# Patient Record
Sex: Female | Born: 1995 | Hispanic: No | Marital: Single | Smoking: Current some day smoker
Health system: Southern US, Community
[De-identification: ages and names within clinical notes are randomized; demographics above are authoritative.]

## PROBLEM LIST (undated history)

## (undated) DIAGNOSIS — E669 Obesity, unspecified: Secondary | ICD-10-CM

## (undated) DIAGNOSIS — G4730 Sleep apnea, unspecified: Secondary | ICD-10-CM

## (undated) HISTORY — DX: Obesity, unspecified: E66.9

---
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
## 2014-01-30 ENCOUNTER — Ambulatory Visit: Payer: Self-pay | Admitting: Pediatrics

## 2014-02-20 ENCOUNTER — Ambulatory Visit (INDEPENDENT_AMBULATORY_CARE_PROVIDER_SITE_OTHER): Payer: Medicaid Other | Admitting: Pediatrics

## 2014-02-20 ENCOUNTER — Encounter: Payer: Self-pay | Admitting: Pediatrics

## 2014-02-20 VITALS — BP 124/80 | Ht 67.76 in | Wt 275.6 lb

## 2014-02-20 DIAGNOSIS — Z975 Presence of (intrauterine) contraceptive device: Secondary | ICD-10-CM

## 2014-02-20 NOTE — Progress Notes (Signed)
Pre-Visit Planning  Last STI screen:  Component     Latest Ref Rng 12/26/2013  Chlamydia, Swab/Urine, PCR     NEGATIVE NEGATIVE  GC Probe Amp, Urine     NEGATIVE NEGATIVE   Pertinent Labs: None  Review of previous notes:  Last seen by Dr. Marina Goodell on 12/26/13.  Treatment plan at last visit included Nexplanon insetion.   Previous Psych Screenings:  RAAPs, CRAFFT,  Psych screenings to be completed at visit: None  Last CPE: 08/15/13 Immunizations Due: FLU

## 2014-02-20 NOTE — Progress Notes (Signed)
10:42 AM  Adolescent Medicine Consultation Follow-Up Visit Carrie Allen  is a 18 y.o. female referred by Dr. Carlynn Purl here today for follow-up of nexplanon placement.   PCP Confirmed?  yes  PEREZ-FIERY,DENISE, MD   History was provided by the patient.  Last STI screen:  Component Latest Ref Rng  12/26/2013   Chlamydia, Swab/Urine, PCR NEGATIVE  NEGATIVE   GC Probe Amp, Urine NEGATIVE  NEGATIVE   Pertinent Labs: None  Review of previous notes:  Last seen by Dr. Marina Goodell on 12/26/13. Treatment plan at last visit included Nexplanon insetion.  Previous Psych Screenings: RAAPs, CRAFFT,  Psych screenings to be completed at visit:  None  Last CPE: 08/15/13  Immunizations Due: FLU indicated but did not offer to patient today  Growth Chart Viewed? yes  HPI:  Pt reports no concerns or questions.  No side effects  Patient's last menstrual period was 01/22/2014.  The following portions of the patient's history were reviewed and updated as appropriate: allergies, current medications and problem list.  No Known Allergies  Social History: Tobacco? Yes, once weekly Secondhand smoke exposure?yes, grandfather  Physical Exam:  Filed Vitals:   02/20/14 1029  BP: 124/80  Height: 5' 7.76" (1.721 m)  Weight: 275 lb 9.6 oz (125.011 kg)   BP 124/80  Ht 5' 7.76" (1.721 m)  Wt 275 lb 9.6 oz (125.011 kg)  BMI 42.21 kg/m2  LMP 01/22/2014 Body mass index: body mass index is 42.21 kg/(m^2). Blood pressure percentiles are 86% systolic and 90% diastolic based on 2000 NHANES data. Blood pressure percentile targets: 90: 126/80, 95: 130/84, 99: 142/96.  Physical Exam  Constitutional: No distress.  Neck: No thyromegaly present.  Cardiovascular: Normal rate and regular rhythm.   No murmur heard. Pulmonary/Chest: Breath sounds normal.  Musculoskeletal: She exhibits no edema.  Intact and well  Lymphadenopathy:    She has no cervical adenopathy.    Assessment/Plan: 1. Presence of subcutaneous  contraceptive implant No significant side effects.  Site well-healed.  Follow-up:  6 months  Medical decision-making:  > 15 minutes spent, more than 50% of appointment was spent discussing diagnosis and management of symptoms

## 2014-05-17 ENCOUNTER — Encounter: Payer: Self-pay | Admitting: Pediatrics

## 2014-09-20 ENCOUNTER — Emergency Department (HOSPITAL_COMMUNITY)
Admission: EM | Admit: 2014-09-20 | Discharge: 2014-09-20 | Disposition: A | Discharge status: Home / Self Care | Payer: Medicaid Other | Attending: Emergency Medicine | Admitting: Emergency Medicine

## 2014-09-20 ENCOUNTER — Encounter (HOSPITAL_COMMUNITY): Payer: Self-pay | Admitting: Emergency Medicine

## 2014-09-20 DIAGNOSIS — R5383 Other fatigue: Secondary | ICD-10-CM | POA: Diagnosis not present

## 2014-09-20 DIAGNOSIS — E669 Obesity, unspecified: Secondary | ICD-10-CM | POA: Insufficient documentation

## 2014-09-20 DIAGNOSIS — F1310 Sedative, hypnotic or anxiolytic abuse, uncomplicated: Secondary | ICD-10-CM | POA: Diagnosis not present

## 2014-09-20 DIAGNOSIS — F1210 Cannabis abuse, uncomplicated: Secondary | ICD-10-CM | POA: Insufficient documentation

## 2014-09-20 DIAGNOSIS — R110 Nausea: Secondary | ICD-10-CM | POA: Diagnosis not present

## 2014-09-20 MED ORDER — ONDANSETRON 8 MG PO TBDP
8.0000 mg | ORAL_TABLET | Freq: Once | ORAL | Status: AC
  Administered 2014-09-20: 8 mg via ORAL
  Filled 2014-09-20: qty 1

## 2014-09-20 NOTE — ED Notes (Signed)
Patient given sprite,. Able to keep down fine.

## 2014-09-20 NOTE — ED Provider Notes (Signed)
CSN: [id number]     Arrival date & time 09/20/14  1501 History   First MD Initiated Contact with Patient 09/20/14 1535     Chief Complaint  Patient presents with  . Fatigue     The history is provided by the patient. No language interpreter was used.   Ms. Duffy RhodyStanley presents for feeling fatigued. She states that last night she smokes weed and took 2 Xanax and passed out. She awoke around 1 PM this afternoon and doesn't know what happened throughout the night but her phone was gone. She denies taking any additional drugs. She denies any SI. She feels very tired and nauseous. She hasn't eaten since yesterday. She denies any fevers, cough, shortness of breath, abdominal pain, vomiting, diarrhea. She has a nexplanon and denies a chance of pregnancy. Symptoms are mild and improving.  Past Medical History  Diagnosis Date  . Obesity    History reviewed. No pertinent past surgical history. Family History  Problem Relation Age of Onset  . Autism Brother    History  Substance Use Topics  . Smoking status: Passive Smoke Exposure - Never Smoker  . Smokeless tobacco: Not on file  . Alcohol Use: No   OB History    No data available     Review of Systems  All other systems reviewed and are negative.     Allergies  Review of patient's allergies indicates no known allergies.  Home Medications   Prior to Admission medications   Not on File   BP 106/61 mmHg  Pulse 92  Temp(Src) 98.4 F (36.9 C) (Oral)  Resp 14  SpO2 98% Physical Exam  Constitutional: She is oriented to person, place, and time. She appears well-developed and well-nourished.  Appears fatigued  HENT:  Head: Normocephalic and atraumatic.  Eyes: Pupils are equal, round, and reactive to light.  Cardiovascular: Normal rate and regular rhythm.   No murmur heard. Pulmonary/Chest: Effort normal and breath sounds normal. No respiratory distress.  Abdominal: Soft. There is no tenderness. There is no rebound and no  guarding.  Musculoskeletal: She exhibits no edema or tenderness.  Neurological: She is alert and oriented to person, place, and time.  Moves all extremities symmetrically  Skin: Skin is warm and dry.  Psychiatric: She has a normal mood and affect. Her behavior is normal.  Nursing note and vitals reviewed.   ED Course  Procedures (including critical care time) Labs Review Labs Reviewed - No data to display  Imaging Review No results found.   EKG Interpretation None      MDM   Final diagnoses:  Nausea   patient here for evaluation of nausea and fatigue after smoking marijuana and taking Xanax yesterday. Patient appears drowsy but is conversant and able to drink and ambulate in the department without difficulty. Discussed with patient avoid illegal substances, oral fluid hydration, PCP follow-up and return precautions. Patient denies any other drug use.    Tilden FossaElizabeth Natallie Ravenscroft, MD 09/20/14 [phone]

## 2014-09-20 NOTE — Discharge Instructions (Signed)
Nausea, Adult Nausea is the feeling that you have an upset stomach or have to vomit. Nausea by itself is not likely a serious concern, but it may be an early sign of more serious medical problems. As nausea gets worse, it can lead to vomiting. If vomiting develops, there is the risk of dehydration.  CAUSES   Viral infections.  Food poisoning.  Medicines.  Pregnancy.  Motion sickness.  Migraine headaches.  Emotional distress.  Severe pain from any source.  Alcohol intoxication. HOME CARE INSTRUCTIONS  Get plenty of rest.  Ask your caregiver about specific rehydration instructions.  Eat small amounts of food and sip liquids more often.  Take all medicines as told by your caregiver. SEEK MEDICAL CARE IF:  You have not improved after 2 days, or you get worse.  You have a headache. SEEK IMMEDIATE MEDICAL CARE IF:   You have a fever.  You faint.  You keep vomiting or have blood in your vomit.  You are extremely weak or dehydrated.  You have dark or bloody stools.  You have severe chest or abdominal pain. MAKE SURE YOU:  Understand these instructions.  Will watch your condition.  Will get help right away if you are not doing well or get worse. Document Released: 06/25/2004 Document Revised: 02/10/2012 Document Reviewed: 01/28/2011 [facility] Patient Information 2015 [place], [place]. This information is not intended to replace advice given to you by your health care provider. Make sure you discuss any questions you have with your health care provider. Polysubstance Abuse When people abuse more than one drug or type of drug it is called polysubstance or polydrug abuse. For example, many smokers also drink alcohol. This is one form of polydrug abuse. Polydrug abuse also refers to the use of a drug to counteract an unpleasant effect produced by another drug. It may also be used to help with withdrawal from another drug. People who take stimulants may become agitated.  Sometimes this agitation is countered with a tranquilizer. This helps protect against the unpleasant side effects. Polydrug abuse also refers to the use of different drugs at the same time.  Anytime drug use is interfering with normal living activities, it has become abuse. This includes problems with family and friends. Psychological dependence has developed when your mind tells you that the drug is needed. This is usually followed by physical dependence which has developed when continuing increases of drug are required to get the same feeling or "high". This is known as addiction or chemical dependency. A person's risk is much higher if there is a history of chemical dependency in the family. SIGNS OF CHEMICAL DEPENDENCY  You have been told by friends or family that drugs have become a problem.  You fight when using drugs.  You are having blackouts (not remembering what you do while using).  You feel sick from using drugs but continue using.  You lie about use or amounts of drugs (chemicals) used.  You need chemicals to get you going.  You are suffering in work performance or in school because of drug use.  You get sick from use of drugs but continue to use anyway.  You need drugs to relate to people or feel comfortable in social situations.  You use drugs to forget problems. "Yes" answered to any of the above signs of chemical dependency indicates there are problems. The longer the use of drugs continues, the greater the problems will become. If there is a family history of drug or alcohol use,  it is best not to experiment with these drugs. Continual use leads to tolerance. After tolerance develops more of the drug is needed to get the same feeling. This is followed by addiction. With addiction, drugs become the most important part of life. It becomes more important to take drugs than participate in the other usual activities of life. This includes relating to friends and family. Addiction  is followed by dependency. Dependency is a condition where drugs are now needed not just to get high, but to feel normal. Addiction cannot be cured but it can be stopped. This often requires outside help and the care of professionals. Treatment centers are listed in the yellow pages under: Cocaine, Narcotics, and Alcoholics Anonymous. Most hospitals and clinics can refer you to a specialized care center. Talk to your caregiver if you need help. Document Released: 01/07/2005 Document Revised: 08/10/2011 Document Reviewed: 05/18/2005 [facility] Patient Information 2015 [place], [place]. This information is not intended to replace advice given to you by your health care provider. Make sure you discuss any questions you have with your health care provider.

## 2014-09-20 NOTE — ED Notes (Signed)
Per EMS: pt smoked weed and took 2 xanax last night and went to sleep, pt woke up this morning still feeling drowsy. Pt states she blacked out last night.

## 2014-10-27 ENCOUNTER — Emergency Department (HOSPITAL_COMMUNITY)
Admission: EM | Admit: 2014-10-27 | Discharge: 2014-10-27 | Disposition: A | Discharge status: Home / Self Care | Payer: Medicaid Other | Attending: Emergency Medicine | Admitting: Emergency Medicine

## 2014-10-27 ENCOUNTER — Encounter (HOSPITAL_COMMUNITY): Payer: Self-pay | Admitting: *Deleted

## 2014-10-27 DIAGNOSIS — F419 Anxiety disorder, unspecified: Secondary | ICD-10-CM | POA: Diagnosis not present

## 2014-10-27 DIAGNOSIS — F329 Major depressive disorder, single episode, unspecified: Secondary | ICD-10-CM | POA: Insufficient documentation

## 2014-10-27 DIAGNOSIS — F1210 Cannabis abuse, uncomplicated: Secondary | ICD-10-CM | POA: Insufficient documentation

## 2014-10-27 DIAGNOSIS — R45851 Suicidal ideations: Secondary | ICD-10-CM | POA: Diagnosis present

## 2014-10-27 DIAGNOSIS — F32A Depression, unspecified: Secondary | ICD-10-CM

## 2014-10-27 DIAGNOSIS — F1410 Cocaine abuse, uncomplicated: Secondary | ICD-10-CM | POA: Diagnosis not present

## 2014-10-27 DIAGNOSIS — E669 Obesity, unspecified: Secondary | ICD-10-CM | POA: Insufficient documentation

## 2014-10-27 DIAGNOSIS — F1910 Other psychoactive substance abuse, uncomplicated: Secondary | ICD-10-CM

## 2014-10-27 LAB — CBC WITH DIFFERENTIAL/PLATELET
BASOS PCT: 0 % (ref 0–1)
Basophils Absolute: 0 10*3/uL (ref 0.0–0.1)
Eosinophils Absolute: 0 10*3/uL (ref 0.0–0.7)
Eosinophils Relative: 0 % (ref 0–5)
HEMATOCRIT: 31.8 % — AB (ref 36.0–46.0)
Hemoglobin: 9.5 g/dL — ABNORMAL LOW (ref 12.0–15.0)
LYMPHS ABS: 1.5 10*3/uL (ref 0.7–4.0)
LYMPHS PCT: 11 % — AB (ref 12–46)
MCH: 21 pg — ABNORMAL LOW (ref 26.0–34.0)
MCHC: 29.9 g/dL — AB (ref 30.0–36.0)
MCV: 70.4 fL — ABNORMAL LOW (ref 78.0–100.0)
MONO ABS: 0.5 10*3/uL (ref 0.1–1.0)
Monocytes Relative: 4 % (ref 3–12)
NEUTROS PCT: 85 % — AB (ref 43–77)
Neutro Abs: 11.7 10*3/uL — ABNORMAL HIGH (ref 1.7–7.7)
Platelets: 258 10*3/uL (ref 150–400)
RBC: 4.52 MIL/uL (ref 3.87–5.11)
RDW: 19.5 % — ABNORMAL HIGH (ref 11.5–15.5)
WBC: 13.7 10*3/uL — ABNORMAL HIGH (ref 4.0–10.5)

## 2014-10-27 LAB — RAPID URINE DRUG SCREEN, HOSP PERFORMED
Amphetamines: NOT DETECTED
BARBITURATES: NOT DETECTED
Benzodiazepines: NOT DETECTED
Cocaine: POSITIVE — AB
Opiates: NOT DETECTED
TETRAHYDROCANNABINOL: POSITIVE — AB

## 2014-10-27 LAB — COMPREHENSIVE METABOLIC PANEL
ALBUMIN: 4.1 g/dL (ref 3.5–5.0)
ALT: 17 U/L (ref 14–54)
AST: 23 U/L (ref 15–41)
Alkaline Phosphatase: 100 U/L (ref 38–126)
Anion gap: 11 (ref 5–15)
BUN: 9 mg/dL (ref 6–20)
CO2: 25 mmol/L (ref 22–32)
CREATININE: 0.72 mg/dL (ref 0.44–1.00)
Calcium: 9.4 mg/dL (ref 8.9–10.3)
Chloride: 102 mmol/L (ref 101–111)
GFR calc Af Amer: >60 mL/min (ref >60)
GFR calc non Af Amer: >60 mL/min (ref >60)
GLUCOSE: 93 mg/dL (ref 65–99)
POTASSIUM: 3.6 mmol/L (ref 3.5–5.1)
SODIUM: 138 mmol/L (ref 135–145)
Total Bilirubin: 0.5 mg/dL (ref 0.3–1.2)
Total Protein: 7.9 g/dL (ref 6.5–8.1)

## 2014-10-27 LAB — ETHANOL: Alcohol, Ethyl (B): <5 mg/dL (ref <5)

## 2014-10-27 MED ORDER — IBUPROFEN 400 MG PO TABS: 600.0000 mg | ORAL_TABLET | Freq: Three times a day (TID) | ORAL | Status: DC | PRN

## 2014-10-27 MED ORDER — ACETAMINOPHEN 325 MG PO TABS: 650.0000 mg | ORAL_TABLET | ORAL | Status: DC | PRN

## 2014-10-27 MED ORDER — ONDANSETRON HCL 4 MG PO TABS: 4.0000 mg | ORAL_TABLET | Freq: Three times a day (TID) | ORAL | Status: DC | PRN

## 2014-10-27 MED ORDER — LORAZEPAM 1 MG PO TABS: 1.0000 mg | ORAL_TABLET | Freq: Three times a day (TID) | ORAL | Status: DC | PRN

## 2014-10-27 NOTE — ED Notes (Addendum)
Pt is here for SI, depression.  She and her boyfriend, who also just checked in, [facility].  [place] their way here [place].  Pt last did molly today and snorted cocaine on Wed.  Pt last drank alcohol yesterday. Pt states she has suicidal thoughts and panic attacks every day.  Pt states she was just kicked out of the "free hotel" this am for doing drugs.

## 2014-10-27 NOTE — ED Provider Notes (Signed)
CSN: [id number]     Arrival date & time 10/27/14  1224 History   First MD Initiated Contact with Patient 10/27/14 1246     Chief Complaint  Patient presents with  . Suicidal     (Consider location/radiation/quality/duration/timing/severity/associated sxs/prior Treatment) HPI    PCP: PEREZ-FIERY,DENISE, MD Blood pressure 146/85, pulse 95, temperature 98.6 F (37 C), temperature source Oral, resp. rate 16, height 5\' 7"  (1.702 m), weight 270 lb (122.471 kg), SpO2 100 %.  Carrie R Duffy RhodyStanley is a 19 y.o.female with a significant PMH of substance abuse presents to the ER with complaints of suicidal ideations and depression. She and her boyfriend have checked into the hospital at the same time, she is upset that they are not staying together because that is why they checked in together. She is tearful and crying because she wants to see him and would like to leave.  She was recently kicked out of a drug rehab program she was staying at because she used drugs on Wednesday for her birthday, she now has no where to go. She does not know why her boyfriend has checked in but said they basically have the same problems. She is unsure if he is depressed or suicidal.. She has recently used HeadrickMolly, cocaine and alcohol within the past week. Patient says that she wasn't lying about being suicidal but that she thought she would get to stay with her boyfriend. She reports only having vague thoughts but no clear plans. Pt reports she does not know what she thought might happened when she checked into the ER for these complaints but didn't care because she thought her and her bf would be together. She perseverates on her boyfriend and can't say a sentence without bringing him up. Denies any clear plans of suicide, reports having fleeting thoughts because she is depressed but denies she would ever do anything.  Past Medical History  Diagnosis Date  . Obesity    History reviewed. No pertinent past surgical  history. Family History  Problem Relation Age of Onset  . Autism Brother    History  Substance Use Topics  . Smoking status: Passive Smoke Exposure - Never Smoker  . Smokeless tobacco: Not on file  . Alcohol Use: Yes     Comment: occ   OB History    No data available     Review of Systems  10 Systems reviewed and are negative for acute change except as noted in the HPI.     Allergies  Review of patient's allergies indicates no known allergies.  Home Medications   Prior to Admission medications   Not on File   BP 146/85 mmHg  Pulse 95  Temp(Src) 98.6 F (37 C) (Oral)  Resp 16  Ht 5\' 7"  (1.702 m)  Wt 270 lb (122.471 kg)  BMI 42.28 kg/m2  SpO2 100% Physical Exam  Constitutional: She appears well-developed and well-nourished. No distress.  HENT:  Head: Normocephalic and atraumatic.  Eyes: Pupils are equal, round, and reactive to light.  Neck: Normal range of motion. Neck supple.  Cardiovascular: Normal rate and regular rhythm.   Pulmonary/Chest: Effort normal.  Abdominal: Soft.  Neurological: She is alert.  Skin: Skin is warm and dry.  Psychiatric: Her speech is normal. Her mood appears anxious. She is not actively hallucinating. She exhibits a depressed mood. She expresses suicidal ideation. She expresses no homicidal ideation. She expresses no suicidal plans and no homicidal plans.  +tearful  Nursing note and vitals reviewed.  ED Course  Procedures (including critical care time) Labs Review Labs Reviewed  CBC WITH DIFFERENTIAL/PLATELET - Abnormal; Notable for the following:    WBC 13.7 (*)    Hemoglobin 9.5 (*)    HCT 31.8 (*)    MCV 70.4 (*)    MCH 21.0 (*)    MCHC 29.9 (*)    RDW 19.5 (*)    All other components within normal limits  COMPREHENSIVE METABOLIC PANEL  ETHANOL  URINE RAPID DRUG SCREEN (HOSP PERFORMED) NOT AT [facility]    Imaging Review No results found.   EKG Interpretation None      MDM   Final diagnoses:  Polysubstance abuse   Anxiety  Depression    Pt is medically cleared, holding orders placed. TTS is going to talk with Psych NP, she suspects she will be monitored overnight and have a Telepsych in the AM.  Filed Vitals:   10/27/14 1230  BP: 146/85  Pulse: 95  Temp: 98.6 F (37 C)  Resp: [address] Neva Seat, PA-C 10/27/14 1615  Determined by Renata Caprice, NP that patient is safe for dc. Pt discharged home with resources.  Marlon Pel, PA-C 10/27/14 1638  Vanetta Mulders, MD 10/30/14 [phone]
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
## 2014-10-27 NOTE — Discharge Instructions (Signed)
Depression °Depression refers to feeling sad, low, down in the dumps, blue, gloomy, or empty. In general, there are two kinds of depression: °1. Normal sadness or normal grief. This kind of depression is one that we all feel from time to time after upsetting life experiences, such as the loss of a job or the ending of a relationship. This kind of depression is considered normal, is short lived, and resolves within a few days to 2 weeks. Depression experienced after the loss of a loved one (bereavement) often lasts longer than 2 weeks but normally gets better with time. °2. Clinical depression. This kind of depression lasts longer than normal sadness or normal grief or interferes with your ability to function at home, at work, and in school. It also interferes with your personal relationships. It affects almost every aspect of your life. Clinical depression is an illness. °Symptoms of depression can also be caused by conditions other than those mentioned above, such as: °· Physical illness. Some physical illnesses, including underactive thyroid gland (hypothyroidism), severe anemia, specific types of cancer, diabetes, uncontrolled seizures, heart and lung problems, strokes, and chronic pain are commonly associated with symptoms of depression. °· Side effects of some prescription medicine. In some people, certain types of medicine can cause symptoms of depression. °· Substance abuse. Abuse of alcohol and illicit drugs can cause symptoms of depression. °SYMPTOMS °Symptoms of normal sadness and normal grief include the following: °· Feeling sad or crying for short periods of time. °· Not caring about anything (apathy). °· Difficulty sleeping or sleeping too much. °· No longer able to enjoy the things you used to enjoy. °· Desire to be by oneself all the time (social isolation). °· Lack of energy or motivation. °· Difficulty concentrating or remembering. °· Change in appetite or weight. °· Restlessness or  agitation. °Symptoms of clinical depression include the same symptoms of normal sadness or normal grief and also the following symptoms: °· Feeling sad or crying all the time. °· Feelings of guilt or worthlessness. °· Feelings of hopelessness or helplessness. °· Thoughts of suicide or the desire to harm yourself (suicidal ideation). °· Loss of touch with reality (psychotic symptoms). Seeing or hearing things that are not real (hallucinations) or having false beliefs about your life or the people around you (delusions and paranoia). °DIAGNOSIS  °The diagnosis of clinical depression is usually based on how bad the symptoms are and how long they have lasted. Your health care provider will also ask you questions about your medical history and substance use to find out if physical illness, use of prescription medicine, or substance abuse is causing your depression. Your health care provider may also order blood tests. °TREATMENT  °Often, normal sadness and normal grief do not require treatment. However, sometimes antidepressant medicine is given for bereavement to ease the depressive symptoms until they resolve. °The treatment for clinical depression depends on how bad the symptoms are but often includes antidepressant medicine, counseling with a mental health professional, or both. Your health care provider will help to determine what treatment is best for you. °Depression caused by physical illness usually goes away with appropriate medical treatment of the illness. If prescription medicine is causing depression, talk with your health care provider about stopping the medicine, decreasing the dose, or changing to another medicine. °Depression caused by the abuse of alcohol or illicit drugs goes away when you stop using these substances. Some adults need professional help in order to stop drinking or using drugs. °SEEK IMMEDIATE MEDICAL   CARE IF:  You have thoughts about hurting yourself or others.  You lose touch  with reality (have psychotic symptoms).  You are taking medicine for depression and have a serious side effect. FOR MORE INFORMATION  National Alliance on Mental Illness: www.nami.[employer]org  National Institute of Mental Health: http://www.maynard.net/www.nimh.nih.gov Document Released: 05/15/2000 Document Revised: 10/02/2013 Document Reviewed: 08/17/2011 [facility] Patient Information 2015 [place], [place]. This information is not intended to replace advice given to you by your health care provider. Make sure you discuss any questions you have with your health care provider.  .resources

## 2014-10-27 NOTE — ED Notes (Signed)
Staffing Office made aware of the need for sitter.

## 2015-03-12 ENCOUNTER — Encounter: Payer: Self-pay | Admitting: Family

## 2015-03-12 NOTE — Progress Notes (Signed)
Patient ID: Smith Mince, female   DOB: 1995/06/22, 19 y.o.   MRN: [id number] Pre-Visit Planning  Dorena R Somera  is a 19 y.o. female referred by Maia Breslow, MD.   Last seen in [facility] on 02/20/2014  for nexplanon placement.   Previous Psych Screenings?  n/a    Clinical Staff Visit Tasks:   - Urine GC/CT due? yes - Psych Screenings Due? n/a - contraception sheet, condoms  Provider Visit Tasks: - Update sexual hx, evaluate menstrual cycle, contraceptive goals  - If nexplanon removal desired, schedule removal f/u  - Pertinent Labs? no

## 2015-03-15 ENCOUNTER — Encounter: Payer: Self-pay | Admitting: Family

## 2015-03-15 ENCOUNTER — Ambulatory Visit (INDEPENDENT_AMBULATORY_CARE_PROVIDER_SITE_OTHER): Payer: Medicaid Other | Admitting: Clinical

## 2015-03-15 ENCOUNTER — Ambulatory Visit (INDEPENDENT_AMBULATORY_CARE_PROVIDER_SITE_OTHER): Payer: Medicaid Other | Admitting: Family

## 2015-03-15 VITALS — BP 140/86 | HR 112 | Ht 67.75 in | Wt 286.6 lb

## 2015-03-15 DIAGNOSIS — R69 Illness, unspecified: Secondary | ICD-10-CM

## 2015-03-15 DIAGNOSIS — Z113 Encounter for screening for infections with a predominantly sexual mode of transmission: Secondary | ICD-10-CM

## 2015-03-15 DIAGNOSIS — Z3009 Encounter for other general counseling and advice on contraception: Secondary | ICD-10-CM

## 2015-03-15 DIAGNOSIS — Z23 Encounter for immunization: Secondary | ICD-10-CM | POA: Diagnosis not present

## 2015-03-15 DIAGNOSIS — Z975 Presence of (intrauterine) contraceptive device: Secondary | ICD-10-CM

## 2015-03-15 DIAGNOSIS — E669 Obesity, unspecified: Secondary | ICD-10-CM | POA: Diagnosis not present

## 2015-03-15 NOTE — Progress Notes (Signed)
THIS RECORD MAY CONTAIN CONFIDENTIAL INFORMATION THAT SHOULD NOT BE RELEASED WITHOUT REVIEW OF THE SERVICE PROVIDER.  Adolescent Medicine Consultation Follow-Up Visit Carrie Allen  is a 19 y.o. female referred by Maia BreslowPerez-Fiery, Denise, MD here today for follow-up of nexplanon and contraceptive management.     Growth Chart Viewed? no   History was provided by the patient.  PCP Confirmed?  yes  My Chart Activated?   no   Previsit planning completed:  Yes,  Patient ID: Carrie Allen, female   DOB: 05-18-1996, 19 y.o.   MRN: [id number] Pre-Visit Planning  Carrie Allen  is a 19 y.o. female referred by Maia BreslowPEREZ-FIERY,DENISE, MD.   Last seen in [facility] on 02/20/2014  for nexplanon placement.   Previous Psych Screenings?  n/a   Clinical Staff Visit Tasks:   - Urine GC/CT due? yes - Psych Screenings Due? n/a - contraception sheet, condoms  Provider Visit Tasks: - Update sexual hx, evaluate menstrual cycle, contraceptive goals  - If nexplanon removal desired, schedule removal f/u  - Pertinent Labs? no    HPI:   19 yo female presents to discuss change in birth control.  She is interested in switching from Nexplanon because she can feel it in her arm when she sleeps.  She has had the implant since last July.  When she first received the nexplanon, she had bleeding dark brown blood for 2 days, then nothing for 3 months, then monthly.  Now it is a light flow for 4 days.  Not cramping every cycle, if she does it is only for first day.  Interested in birth control pills.   BF wants to have a kid. Living together now.  She questions if she needs to be tested for HSV; BF has outbreaks periodically and they do not have intercourse when he has an outbreak. He has medication for the outbreaks, however does not always take it. She denies vaginal or oral lesions, no vaginal discharge changes, no pain with intercourse, no abdominal pain, or prodrome.  -She notes that she  has concerns over her weight and does not want to conceive right away; maybe sometime next year. Has some anxiety and depression; she denies SI/HI.   Patient's last menstrual period was 02/20/2015 (exact date). No Known Allergies No current outpatient prescriptions on file prior to visit.   No current facility-administered medications on file prior to visit.    Confidentiality was discussed with the patient and if applicable, with caregiver as well.  Patient's personal or confidential phone number: on file  Tobacco?  Yes, shares packs with boyfriend; 1-2 packs shared/day Drugs/ETOH?  Yes, marijuana  Partner preference?  female Sexually Active?  yes   Pregnancy Prevention:  implant, reviewed condoms & plan B Safe at home, in school & in relationships?  Yes Safe to self?  Yes    The following portions of the patient's history were reviewed and updated as appropriate: allergies, current medications, past family history, past medical history, past social history, past surgical history and problem list.  Physical Exam:  Filed Vitals:   03/15/15 1356  BP: 140/86  Pulse: 112  Height: 5' 7.75" (1.721 m)  Weight: 286 lb 9.6 oz (130.001 kg)   BP 140/86 mmHg  Pulse 112  Ht 5' 7.75" (1.721 m)  Wt 286 lb 9.6 oz (130.001 kg)  BMI 43.89 kg/m2  LMP 02/20/2015 (Exact Date) Body mass index: body mass index is 43.89 kg/(m^2). Facility age limit for growth percentiles is 20  years.  Physical Exam  Constitutional: She is oriented to person, place, and time. She appears well-developed. No distress.  Obese   HENT:  Head: Normocephalic and atraumatic.  Mouth/Throat: Oropharynx is clear and moist.  Eyes: EOM are normal. Pupils are equal, round, and reactive to light. No scleral icterus.  Neck: Normal range of motion. No thyromegaly present.  Cardiovascular: Normal rate, regular rhythm and normal heart sounds.   No murmur heard. Pulmonary/Chest: Effort normal and breath sounds normal. She has no  wheezes.  Abdominal: Soft. There is no tenderness.  Musculoskeletal: Normal range of motion. She exhibits no edema or tenderness.  Neurological: She is alert and oriented to person, place, and time.  Skin: Skin is warm and dry. No rash noted.  Psychiatric: She has a normal mood and affect.     Assessment/Plan: 1. Encounter for general counseling and advice on contraceptive management -discussed MEC for OCPs - discussed risks associated with smoking;  -smoking cessation discussed; patient pre-contemplative only.  -discussed preconception health; advised to stop smoking cigarettes and marijuana ideally at least 3 months before conception.  -also addressed prenatal vitamins; will send.  -patient to return for nexplanon removal and will begin OCPs at that time. No liver problems, no DVT, no PE; no cervical or breast CA; no migraines with aura.  -She was agreeable to speak with Jasmine briefly at this OV and for return OV with joint visit to further explore her concerns over anxiety/depression. Stable with no SI/HI at present.   2. Presence of subcutaneous contraceptive implant -palpable in arm; well-healed incision.    3. Obesity -discussed healthy weight prior to conception; encouraged increased physical activity and healthy eating/diet prior to conception.   4. Routine screening for STI (sexually transmitted infection) -discussed routine gc/c screening -extensive conversation around HSV, viral shedding, and that asymptomatic screening is not recommended.  -Encouraged condom use.  - GC/chlamydia probe amp, urine  5. Need for vaccination - Flu Vaccine QUAD 36+ mos IM   Follow-up:  2 weeks for removal.    Medical decision-making:  > 40 minutes spent, more than 50% of appointment was spent discussing diagnosis and management of symptoms

## 2015-03-16 LAB — GC/CHLAMYDIA PROBE AMP, URINE
CHLAMYDIA, SWAB/URINE, PCR: NEGATIVE
GC PROBE AMP, URINE: NEGATIVE

## 2015-03-18 ENCOUNTER — Telehealth: Payer: Self-pay

## 2015-03-18 NOTE — Telephone Encounter (Signed)
-----   Message from Christianne Dolinhristy Millican, NP sent at 03/17/2015  4:49 PM EDT ----- -gc/c negative. Notify patient.

## 2015-03-18 NOTE — Telephone Encounter (Signed)
RN called and spoke to Carrie Allen to let her know her lab results for GC/CT probe came back negative, no signs of infections. Carrie Allen stated gratitude for call with no further questions or concerns at this time.

## 2015-03-18 NOTE — BH Specialist Note (Signed)
VISIT DATE: 03/15/15 Referring Provider: Maia BreslowPEREZ-FIERY,DENISE, MD Session Time:  1540- 1550 (10 min) Type of Service: Behavioral Health - Individual/Family Interpreter: No.  Interpreter Name & Language: N/A   PRESENTING CONCERNS:  Carrie Allen is a 19 y.o. female brought in by grandmother. [facility] by C. Millican, NP regarding concerns with symptoms of anxiety & depression.  Carrie Allen could not stay long since her grandmother was waiting for her.   GOALS ADDRESSED:  Increase adequate support system.   INTERVENTIONS:  Introduced [facility] services within integrated team   ASSESSMENT/OUTCOME:  Carrie Allen agreed to meet [facility] another time.    PLAN FOR NEXT VISIT: Assess concerns further Identify specific goal   Scheduled next visit: 04/02/15 Joint visit with Carrie Allen. Millican, NP  Carrie BossierJasmine P Williams LCSW Behavioral Health Clinician [facility]   No charge for this visit due to brief length of time.

## 2015-04-01 ENCOUNTER — Encounter: Payer: Self-pay | Admitting: Pediatrics

## 2015-04-01 NOTE — Progress Notes (Signed)
Pre-Visit Planning  Carrie Allen  is a 19 y.o. female referred by Jairo BenMCQUEEN,SHANNON D, MD.   Last seen in [facility]oles[facility] Medicine Clinic on 03/15/15 for Nexplanon f/u. This visit is for depression/anxiety evaluation (joint visit with behavioral health).  Previous Psych Screenings?  yes CRAFFT: 2 positive responses.  Treatment plan at last visit included referral to behavior health clinician and planned joint visit on 04/02/15.  Clinical Staff Visit Tasks:   - Urine GC/CT due? No: negative on 03/15/15 - Psych Screenings Due? Yes: PHQ-SADS, ASRS, CRAFFT   Provider Visit Tasks: - evaluate substance abuse support, evaluate depressive/anxiety/ADHD symptoms - Pertinent Labs? Yes: cocaine and marijuana + UDS 10/27/14

## 2015-04-02 ENCOUNTER — Ambulatory Visit (INDEPENDENT_AMBULATORY_CARE_PROVIDER_SITE_OTHER): Payer: Medicaid Other | Admitting: Family

## 2015-04-02 ENCOUNTER — Encounter: Payer: Self-pay | Admitting: Family

## 2015-04-02 ENCOUNTER — Ambulatory Visit (INDEPENDENT_AMBULATORY_CARE_PROVIDER_SITE_OTHER): Payer: Medicaid Other | Admitting: Clinical

## 2015-04-02 VITALS — BP 138/88 | HR 105 | Ht 67.32 in | Wt 296.2 lb

## 2015-04-02 DIAGNOSIS — Z3049 Encounter for surveillance of other contraceptives: Secondary | ICD-10-CM

## 2015-04-02 DIAGNOSIS — F4323 Adjustment disorder with mixed anxiety and depressed mood: Secondary | ICD-10-CM

## 2015-04-02 DIAGNOSIS — Z30011 Encounter for initial prescription of contraceptive pills: Secondary | ICD-10-CM

## 2015-04-02 DIAGNOSIS — Z3046 Encounter for surveillance of implantable subdermal contraceptive: Secondary | ICD-10-CM

## 2015-04-02 MED ORDER — PRENATAL VITAMINS PLUS 27-1 MG PO TABS: 1.0000 | ORAL_TABLET | Freq: Every day | ORAL | Status: DC

## 2015-04-02 MED ORDER — NORETHIN ACE-ETH ESTRAD-FE 1.5-30 MG-MCG PO TABS: 1.0000 | ORAL_TABLET | Freq: Every day | ORAL | Status: DC

## 2015-04-02 NOTE — BH Specialist Note (Signed)
Referring Provider: Jairo BenMCQUEEN,SHANNON D, MD Session Time:  [id number] (45 minutes) Type of Service: Behavioral Health - Individual/Family Interpreter: No.  Interpreter Name & Language: N/A   PRESENTING CONCERNS:  Carrie Allen is a 19 y.o. female brought in by boyfriend. [facility] referred by C. Millican, NP regarding concerns with symptoms of anxiety & depression.  Carrie Allen wanted her boyfriend, Carrie Allen, present during the visit.  She reported ongoing stress living with her grandmother.   GOALS ADDRESSED:  Increase healthy habits Increase use of positive coping skills    INTERVENTIONS:  Introduced [facility] services within integrated team  SCREENS/ASSESSMENT TOOLS COMPLETED: PHQ-SADS 04/02/2015  PHQ-15 7  GAD-7 7  PHQ-9 15  ASRS 04/02/2015  Part A Total Symptoms Positive 0  Part B Total Symptoms Positive 7    CRAFFT Screening Questions: Part A - Yes to Drinking Alcohol & Smoking marijuana Part B - Yes to using alcohol or drugs to relax   ASSESSMENT/OUTCOME:  Carrie Allen appeared quiet at first but opened up as the visit continued.  Carrie Allen's boyfriend was engaged at times but let Carrie Allen talk through most of the visit.  Carrie Allen's goals today were to get the Nexplanon out and be more healthy since she's concerned about her weight.  Carrie Allen reported since she's lived with her grandmother, her physical & eating habits have worsened.  Carrie Allen's wants to get a job so she can have money to buy healthier foods and spend more time with her boyfriend & friends.  Carrie Allen reported thoughts of being better off denied but denied intent to kill herself or others.  She reported her boyfriend is supportive and she is able to talk to him about everything.  She stated that she would not harm or kill herself because of him.  Carrie Allen was not ready to change her alcohol or substance use.  She was motivated to change her eating & exercise habits.  She was willing to come back and identify more specific goals  that she wants to work on to be healthier.    PLAN FOR NEXT VISIT: Identify healthy habit she wants to work on with diet & exercise.    Scheduled next visit: [facility], M. Morris  Carrie Allen Behavioral Health Clinician [facility]

## 2015-04-02 NOTE — Patient Instructions (Addendum)
See Jasmine in 2 weeks; we will touch base then.  I will see you in 4 weeks.   Follow-up with  1 month. Schedule this appointment before you leave clinic today.  Your Nexplanon was removed today and is no longer preventing pregnancy.  If you have sex, remember to use condoms to prevent pregnancy and to prevent sexually transmitted infections.  Leave the outside bandage on for 24 hours.  Leave the smaller bandages on for 3-5 days or until they fall off on their own.  Keep the area clean and dry for 3-5 days.  There is usually bruising or swelling at and around the removal site for a few days to a week after the removal.  If you see redness or pus draining from the removal site, call us immediately.  We would like you to return to the clinic for a follow-up visit in 1 month.  You can call [facility] 24 hours a day with any questions or concerns.  There is always a nurse or doctor available to take your call.  Call 9-1-1 if you have a life-threatening emergency.  For anything else, please call us at [phone] before heading to the ER.

## 2015-04-02 NOTE — Progress Notes (Signed)
THIS RECORD MAY CONTAIN CONFIDENTIAL INFORMATION THAT SHOULD NOT BE RELEASED WITHOUT REVIEW OF THE SERVICE PROVIDER.  Adolescent Medicine Consultation Follow-Up Visit Carrie Allen  is a 19 y.o. female referred by Maia BreslowPerez-Fiery, Denise, MD here today for follow-up.    Growth Chart Viewed? no   History was provided by the patient.  PCP Confirmed?  Levora AngelYes, McQueen, MD    My Chart Activated?   no   Previsit planning completed:  Yes Pre-Visit Planning  Carrie Allen  is a 19 y.o. female referred by Jairo BenMCQUEEN,SHANNON D, MD.   Last seen in Adolescent Medicine Clinic on 03/15/15 for Nexplanon f/u. This visit is for depression/anxiety evaluation (joint visit with behavioral health).  Previous Psych Screenings?  yes CRAFFT: 2 positive responses.  Treatment plan at last visit included referral to behavior health clinician and planned joint visit on 04/02/15.  Clinical Staff Visit Tasks:   - Urine GC/CT due? No: negative on 03/15/15 - Psych Screenings Due? Yes: PHQ-SADS, ASRS, CRAFFT,   Provider Visit Tasks: - evaluate substance abuse support, evaluate depressive/anxiety/ADHD symptoms - Pertinent Labs? Yes: cocaine and marijuana + UDS 10/27/14   HPI:    19 yo female presents today for nexplanon removal. [facility] where she completed several screens: ASRS, CRAFFT, and PHQ-SADs. She plans to see Leavy CellaJasmine again in two weeks and would like to follow up on the screens at that time. Boyfriend accompanies her for this OV and she is ready to get the Nexplanon out. She would like to start OCPs today and has no further questions about that.     Patient's last menstrual period was 02/15/2015 (approximate). No Known Allergies No current outpatient prescriptions on file prior to visit.   No current facility-administered medications on file prior to visit.    Confidentiality was discussed with the patient and if applicable, with caregiver as well.  Patient's personal or  confidential phone number: on file  Review of Systems  Constitutional: Negative.   HENT: Negative.   Eyes: Negative.   Respiratory: Negative.   Cardiovascular: Negative.   Gastrointestinal: Negative.   Genitourinary: Negative.   Musculoskeletal: Negative.   Skin: Negative.   Neurological: Negative.   Endo/Heme/Allergies: Negative.     The following portions of the patient's history were reviewed and updated as appropriate: allergies, past family history, past medical history, past social history, past surgical history and problem list.  Physical Exam:  Filed Vitals:   04/02/15 1426  BP: 138/88  Pulse: 105  Height: 5' 7.32" (1.71 m)  Weight: 296 lb 3.2 oz (134.355 kg)   BP 138/88 mmHg  Pulse 105  Ht 5' 7.32" (1.71 m)  Wt 296 lb 3.2 oz (134.355 kg)  BMI 45.95 kg/m2  LMP 02/15/2015 (Approximate) Body mass index: body mass index is 45.95 kg/(m^2). Facility age limit for growth percentiles is 20 years.  Physical Exam  Constitutional: She is oriented to person, place, and time. She appears well-developed. No distress.  HENT:  Head: Normocephalic and atraumatic.  Eyes: EOM are normal. Pupils are equal, round, and reactive to light. No scleral icterus.  Neck: Normal range of motion. Neck supple. No thyromegaly present.  Cardiovascular: Normal rate, regular rhythm, normal heart sounds and intact distal pulses.   No murmur heard. Pulmonary/Chest: Effort normal and breath sounds normal.  Abdominal: Soft.  Musculoskeletal: Normal range of motion. She exhibits no edema.  Lymphadenopathy:    She has no cervical adenopathy.  Neurological: She is alert and  oriented to person, place, and time. No cranial nerve deficit.  Skin: Skin is warm and dry. No rash noted.  Psychiatric: She has a normal mood and affect. Her behavior is normal. Judgment and thought content normal.  Vitals reviewed.    Assessment/Plan: 1. Encounter for Nexplanon removal  Risks & benefits of Nexplanon  removal discussed. Consent form signed.  The patient denies any allergies to anesthetics or antiseptics.  Procedure: Pt was placed in supine position. left arm was flexed at the elbow and externally rotated so that her wrist was parallel to her ear, The device was palpated and marked. The site was cleaned with Betadine. The area surrounding the device was covered with a sterile drape. 1% lidocaine was injected just under the device. A scalpel was used to create a small incision. The device was pushed towards the incision. Fibrous tissue surrounding the device was gradually removed from the device. The device was removed and measured to ensure all 4 cm of device was removed. Steri-strips were used to close the incision. Pressure dressing was applied to the patient.  The patient was instructed to removed the pressure dressing in 24 hrs.  The patient was advised to move slowly from a supine to an upright position  The patient denied any concerns or complaints  The patient was instructed to schedule a follow-up appt in 1 month. The patient will be called in 1 week to address any concerns.   2. OCP (oral contraceptive pills) initiation -start OCPs, start prenatals.  -will check  - norethindrone-ethinyl estradiol-iron (MICROGESTIN FE,GILDESS FE,LOESTRIN FE) 1.5-30 MG-MCG tablet; Take 1 tablet by mouth daily.  Dispense: 1 Package; Refill: 11 - Prenatal Vit-Fe Fumarate-FA (PRENATAL VITAMINS PLUS) 27-1 MG TABS; Take 1 tablet by mouth daily.  Dispense: 30 tablet; Refill: 11   Follow-up:  Return in about 4 weeks (around 04/30/2015) for with Christianne Dolin, FNP-C, Behavioral Health follow-up, OCP follow-up. She will f/u with Jasmine in 2 weeks.    Medical decision-making:  > 25  minutes spent, more than 50% of appointment was spent discussing diagnosis and management of symptoms

## 2015-04-03 DIAGNOSIS — F4323 Adjustment disorder with mixed anxiety and depressed mood: Secondary | ICD-10-CM

## 2015-04-03 HISTORY — DX: Adjustment disorder with mixed anxiety and depressed mood: F43.23

## 2015-04-19 ENCOUNTER — Ambulatory Visit (INDEPENDENT_AMBULATORY_CARE_PROVIDER_SITE_OTHER): Payer: Medicaid Other | Admitting: Clinical

## 2015-04-19 DIAGNOSIS — F4323 Adjustment disorder with mixed anxiety and depressed mood: Secondary | ICD-10-CM
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
## 2015-05-06 ENCOUNTER — Encounter: Payer: Self-pay | Admitting: Family

## 2015-05-06 ENCOUNTER — Ambulatory Visit (INDEPENDENT_AMBULATORY_CARE_PROVIDER_SITE_OTHER): Payer: Medicaid Other | Admitting: Licensed Clinical Social Worker

## 2015-05-06 ENCOUNTER — Ambulatory Visit (INDEPENDENT_AMBULATORY_CARE_PROVIDER_SITE_OTHER): Payer: Medicaid Other | Admitting: Family

## 2015-05-06 VITALS — BP 135/80 | HR 100 | Ht 68.0 in | Wt 295.8 lb

## 2015-05-06 DIAGNOSIS — F4323 Adjustment disorder with mixed anxiety and depressed mood: Secondary | ICD-10-CM

## 2015-05-06 DIAGNOSIS — IMO0001 Reserved for inherently not codable concepts without codable children: Secondary | ICD-10-CM

## 2015-05-06 DIAGNOSIS — R030 Elevated blood-pressure reading, without diagnosis of hypertension: Secondary | ICD-10-CM

## 2015-05-06 DIAGNOSIS — Z3041 Encounter for surveillance of contraceptive pills: Secondary | ICD-10-CM | POA: Diagnosis not present

## 2015-05-06 NOTE — Patient Instructions (Signed)
Continue taking your birth control pill daily as directed.  Please go to the pharmacy to pick up your vitamin.  You blood pressure was a little elevated today.  I recommend that you follow up with your primary care doctor in the next 2 weeks to check this again.  Great job on not smoking these last few months.  Keep up the good work!  Oral Contraception Use Oral contraceptive pills (OCPs) are medicines taken to prevent pregnancy. OCPs work by preventing the ovaries from releasing eggs. The hormones in OCPs also cause the cervical mucus to thicken, preventing the sperm from entering the uterus. The hormones also cause the uterine lining to become thin, not allowing a fertilized egg to attach to the inside of the uterus. OCPs are highly effective when taken exactly as prescribed. However, OCPs do not prevent sexually transmitted diseases (STDs). Safe sex practices, such as using condoms along with an OCP, can help prevent STDs. Before taking OCPs, you may have a physical exam and Pap test. Your health care provider may also order blood tests if necessary. Your health care provider will make sure you are a good candidate for oral contraception. Discuss with your health care provider the possible side effects of the OCP you may be prescribed. When starting an OCP, it can take 2 to 3 months for the body to adjust to the changes in hormone levels in your body.  HOW TO TAKE ORAL CONTRACEPTIVE PILLS Your health care provider may advise you on how to start taking the first cycle of OCPs. Otherwise, you can:   Start on day 1 of your menstrual period. You will not need any backup contraceptive protection with this start time.   Start on the first Sunday after your menstrual period or the day you get your prescription. In these cases, you will need to use backup contraceptive protection for the first week.   Start the pill at any time of your cycle. If you take the pill within 5 days of the start of your period,  you are protected against pregnancy right away. In this case, you will not need a backup form of birth control. If you start at any other time of your menstrual cycle, you will need to use another form of birth control for 7 days. If your OCP is the type called a minipill, it will protect you from pregnancy after taking it for 2 days (48 hours). After you have started taking OCPs:   If you forget to take 1 pill, take it as soon as you remember. Take the next pill at the regular time.   If you miss 2 or more pills, call your health care provider because different pills have different instructions for missed doses. Use backup birth control until your next menstrual period starts.   If you use a 28-day pack that contains inactive pills and you miss 1 of the last 7 pills (pills with no hormones), it will not matter. Throw away the rest of the non-hormone pills and start a new pill pack.  No matter which day you start the OCP, you will always start a new pack on that same day of the week. Have an extra pack of OCPs and a backup contraceptive method available in case you miss some pills or lose your OCP pack.  HOME CARE INSTRUCTIONS   Do not smoke.   Always use a condom to protect against STDs. OCPs do not protect against STDs.   Use a calendar  to mark your menstrual period days.   Read the information and directions that came with your OCP. Talk to your health care provider if you have questions.  SEEK MEDICAL CARE IF:   You develop nausea and vomiting.   You have abnormal vaginal discharge or bleeding.   You develop a rash.   You miss your menstrual period.   You are losing your hair.   You need treatment for mood swings or depression.   You get dizzy when taking the OCP.   You develop acne from taking the OCP.   You become pregnant.  SEEK IMMEDIATE MEDICAL CARE IF:   You develop chest pain.   You develop shortness of breath.   You have an uncontrolled or  severe headache.   You develop numbness or slurred speech.   You develop visual problems.   You develop pain, redness, and swelling in the legs.    This information is not intended to replace advice given to you by your health care provider. Make sure you discuss any questions you have with your health care provider.   Document Released: 05/07/2011 Document Revised: 06/08/2014 Document Reviewed: 11/06/2012 Elsevier Interactive Patient Education [employer]

## 2015-05-06 NOTE — Progress Notes (Signed)
THIS RECORD MAY CONTAIN CONFIDENTIAL INFORMATION THAT SHOULD NOT BE RELEASED WITHOUT REVIEW OF THE SERVICE PROVIDER.  Adolescent Medicine Consultation Follow-Up Visit Carrie Allen  is a 19 y.o. female referred by Kalman JewelsMcQueen, Shannon, MD here today for follow-up.    Previsit planning completed:  No - Nexplanon removal follow-up.  She was started on LOESTRIN FE and prenatals at last visit.   Growth Chart Viewed? not applicable   History was provided by the patient.  PCP Confirmed?  Yes, Kalman JewelsShannon McQueen  My Chart Activated?   no - code expired.   HPI:   Patient reports that she had a period off and on for about 1 month after removal.  She reports that her vaginal bleeding was "on" more than it was "off".  She's been without vaginal bleeding/ spotting for at least the last week.  She notes that cramping was really bad.  Denies dizziness, shortness of breath, heart palpitations.  Recently, recovering from a URI.  Has just started her second pack of OCPs.  She never picked up MVIs from pharmacy.    Patient's last menstrual period was 04/08/2015 (approximate). No Known Allergies Current Outpatient Prescriptions on File Prior to Visit  Medication Sig Dispense Refill  . norethindrone-ethinyl estradiol-iron (MICROGESTIN FE,GILDESS FE,LOESTRIN FE) 1.5-30 MG-MCG tablet Take 1 tablet by mouth daily. 1 Package 11  . Prenatal Vit-Fe Fumarate-FA (PRENATAL VITAMINS PLUS) 27-1 MG TABS Take 1 tablet by mouth daily. (Patient not taking: Reported on 05/06/2015) 30 tablet 11   No current facility-administered medications on file prior to visit.    Social History: Reviewed and up to date.  Confidentiality was discussed with the patient and if applicable, with caregiver as well.  Tobacco?  yes, has not smoked in several months Drugs/ETOH?  no Partner preference?  female Sexually Active?  yes   Pregnancy Prevention:  birth control pills, reviewed condoms & plan B.  Declines plan B Safe at home, in school  & in relationships?  Yes Safe to self?  Yes   The following portions of the patient's history were reviewed and updated as appropriate:  She  has a past medical history of Obesity. She  does not have any pertinent problems on file. She  has no past surgical history on file. Her family history includes Autism in her brother. She  reports that she has been passively smoking.  She has never used smokeless tobacco. She reports that she drinks alcohol. 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 She has a current medication list which includes the following prescription(s): norethindrone-ethinyl estradiol-iron and prenatal vitamins plus. Current Outpatient Prescriptions on File Prior to Visit  Medication Sig Dispense Refill  . norethindrone-ethinyl estradiol-iron (MICROGESTIN FE,GILDESS FE,LOESTRIN FE) 1.5-30 MG-MCG tablet Take 1 tablet by mouth daily. 1 Package 11  . Prenatal Vit-Fe Fumarate-FA (PRENATAL VITAMINS PLUS) 27-1 MG TABS Take 1 tablet by mouth daily. (Patient not taking: Reported on 05/06/2015) 30 tablet 11   No current facility-administered medications on file prior to visit.   She has No Known Allergies..  Physical Exam:  Filed Vitals:   05/06/15 0957  BP: 135/80  Pulse: 100  Height: 5\' 8"  (1.727 m)  Weight: 295 lb 12.8 oz (134.174 kg)   BP 135/80 mmHg  Pulse 100  Ht 5\' 8"  (1.727 m)  Wt 295 lb 12.8 oz (134.174 kg)  BMI 44.99 kg/m2  LMP 04/08/2015 (Approximate) Body mass index: body mass index is 44.99 kg/(m^2). Facility age limit for growth percentiles is 20  years.  Physical Exam  Constitutional: She appears well-developed and well-nourished.  obese  Eyes: Pupils are equal, round, and reactive to light.  +conjunctival pallor  Cardiovascular: Normal rate, regular rhythm and normal heart sounds.   Pulmonary/Chest: Effort normal and breath sounds normal. She has no wheezes.  Abdominal: Soft. Bowel sounds are normal. She exhibits no mass. There is  no tenderness.  obese     Assessment/Plan: 1. Encounter for surveillance of contraceptive pills.  Doing well on OCP.  Had prolonged vaginal bleeding after discontinuation of Nexplanon, which we discussed is common.  Conjunctival pallor on exam, otherwise no symptoms of anemia. - Patient to pick up MVI - Continue OCP as directed - Reviewed safe sex practices - Follow up with PCP as scheduled for routine care.  2. Elevated blood pressure. 135/80 at today's visit.  Patient obese. - Recommend lifestyle modifications - To follow up with PCP in next 2 weeks for BP recheck and continue lifestyle modification counseling.   - Consider referral to Registered Dietician.   Follow-up:  Return in about 2 weeks (around 05/20/2015) for Blood pressure follow up with primary care doctor.Rozell Searing. Nadine Counts, DO PGY-2, [facility]

## 2015-05-06 NOTE — BH Specialist Note (Signed)
Referring Provider: Jairo BenMCQUEEN,SHANNON D, MD Session Time:  1610-9604:  1209-1302 (53 minutes) Type of Service: Behavioral Health - Individual/Family Interpreter: No.  Interpreter Name & Language: N/A   PRESENTING CONCERNS:  Carrie Allen is a 19 y.o. female brought in by self. This [facility] was referred by C. Millican, NP regarding concerns with symptoms of anxiety & depression.  [name] wanted her boyfriend present during the visit.  She reported ongoing stress living with her grandmother.   GOALS ADDRESSED:  Create action plan for transportation to potential job interviews   INTERVENTIONS:  Assessed current condition/needs Built rapport Specific problem-solving   ASSESSMENT/OUTCOME:  [name] wanted her boyfriend present for this visit. She stated that he could add to her description of current situation with her grandma ("granny").   Carrie Allen has submitted her resume to different places online, and received a call back to come in to turn in an application at at hotel. She planned to go, but her grandmother asked her to take her on errands and she missed the time frame to go for the application.   Carrie Allen presented as very frustrated in this session, and asked for the space to vent. She expressed the desire to get a job so she has more autonomy. She does not feel respected by her Trevor MaceGranny and feels as though she has no privacy. She reported feeling "stuck" in the house and constantly fighting with Trevor MaceGranny.   This BH intern offered suggestions of ways Carrie Allen might get transportation to job interview without her Trevor MaceGranny, such as asking a friend, taking the bus, or telling her Trevor MaceGranny in advance when she will need the car. Carrie Allen did not seem to think these suggestions would work, but said she would try them. She agreed to be direct with her Trevor MaceGranny when she has to be at an interview, rather than give "hints" and assume what her grandmother will say to her.    PLAN FOR NEXT VISIT: Assess if ongoing goals might  include specific healthy habit she wants to work on with diet & exercise. Assess for substance use and SI Get update on job search Identify coping mechanisms for when Carrie Allen is angry  Next visit: 05/13/15 at 3:15pm with [facility] J. Rene KocherWilliams  Madeleine Morris Behavioral Health Intern, [facility]

## 2015-05-07 ENCOUNTER — Other Ambulatory Visit: Payer: Self-pay | Admitting: Family Medicine

## 2015-05-08 ENCOUNTER — Telehealth: Payer: Self-pay | Admitting: Licensed Clinical Social Worker

## 2015-05-08 NOTE — Telephone Encounter (Signed)
This BH intern called to inform pt of next apt time on provided cell number [phone]). Left message with information.   Tana ConchMadeleine Morris Behavioral Health Intern, [facility]

## 2015-05-13 ENCOUNTER — Ambulatory Visit (INDEPENDENT_AMBULATORY_CARE_PROVIDER_SITE_OTHER): Payer: Medicaid Other | Admitting: Clinical

## 2015-05-13 DIAGNOSIS — F4323 Adjustment disorder with mixed anxiety and depressed mood: Secondary | ICD-10-CM | POA: Diagnosis not present

## 2015-05-13 NOTE — BH Specialist Note (Signed)
Referring Provider: Jairo BenMCQUEEN,SHANNON D, MD Session Time:  1610-9604:  1505-1620  (75 minutes) Type of Service: Behavioral Health - Individual/Family Interpreter: No.  Interpreter Name & Language: N/A   PRESENTING CONCERNS:  Karren R Duffy RhodyStanley is a [age] y.o. female brought in by self. This [facility] was referred by C. Millican, NP regarding concerns with symptoms of anxiety & depression.  She reported ongoing stress living with her grandmother.  She was able to identify that she felt tightness in her chest when she gets mad.  She acknowledged that at the last visit, her blood pressure was not as good as previous times.   GOALS ADDRESSED:  Increase knowledge on her actions affecting her health as evidenced by her self-report. Increase her ability to change her behavior in order to be more relaxed as evidenced by her self-report.   INTERVENTIONS:  Assessed current condition/needs Assessed SI Psycho education on thoughts, feelings & actions using [facility]    ASSESSMENT/OUTCOME:  Sherriann presented to be well-groomed and talkative during the visit.  She reported having a stressful week arguing with her grandmother, who she lives with.  Fritzi denied any current SI, even with the stressful week.  Fawne was minimally interested in the connection between her thoughts, feelings & actions.  Marlise would continuously return to her grandmother's negative behaviors affecting her and initially did not want to change any of her own responses.  Kaiesha was able to identify that if she stopped throwing things or "stomping" around the house as her grandmother reports, then her grandmother would not say negative things to her, which would decrease Lanier' stress level.  TREATMENT PLAN: Emmely will refrain from throwing things and being more aware of how she walks around the house.  PLAN FOR NEXT VISIT: Assess if ongoing goals might include specific healthy habit  Review changes in behavior and it's affect on stress  level at home  Next visit: 06/19/14 Joint visit with Dr. Wylene SimmerMcQueen     Masson Nalepa P. Mayford KnifeWilliams, MSW, LCSW Lead Behavioral Health Clinician [facility] Office Tel: [phone] Fax: [phone]

## 2015-05-20 ENCOUNTER — Ambulatory Visit: Payer: Medicaid Other | Admitting: Pediatrics

## 2015-05-20 ENCOUNTER — Encounter: Payer: Medicaid Other | Admitting: Licensed Clinical Social Worker

## 2015-05-20 ENCOUNTER — Encounter: Payer: Self-pay | Admitting: Pediatrics

## 2015-09-16 ENCOUNTER — Ambulatory Visit (INDEPENDENT_AMBULATORY_CARE_PROVIDER_SITE_OTHER): Payer: Medicaid Other | Admitting: Pediatrics

## 2015-09-16 ENCOUNTER — Encounter: Payer: Self-pay | Admitting: Pediatrics

## 2015-09-16 VITALS — BP 122/86 | Wt 310.8 lb

## 2015-09-16 DIAGNOSIS — Z91048 Other nonmedicinal substance allergy status: Secondary | ICD-10-CM | POA: Diagnosis not present

## 2015-09-16 DIAGNOSIS — L309 Dermatitis, unspecified: Secondary | ICD-10-CM | POA: Diagnosis not present

## 2015-09-16 DIAGNOSIS — F1990 Other psychoactive substance use, unspecified, uncomplicated: Secondary | ICD-10-CM | POA: Diagnosis not present

## 2015-09-16 DIAGNOSIS — Z9109 Other allergy status, other than to drugs and biological substances: Secondary | ICD-10-CM | POA: Insufficient documentation

## 2015-09-16 MED ORDER — TRIAMCINOLONE ACETONIDE 0.5 % EX OINT: 1.0000 "application " | TOPICAL_OINTMENT | Freq: Two times a day (BID) | CUTANEOUS | Status: DC

## 2015-09-16 NOTE — Progress Notes (Signed)
Subjective:    Valecia is a 20 y.o. old female here with her self for Eczema and OTHER .    HPI   This 20 year old is here with a history of eczema. She uses dove soap and baths daily-she uses coconut oil after bath. She does not use vaseline or eucerin. She does not have a steroid cream.   She is also asking for a drug screen. She has smoked marijuana and wants to quit so she would like a drug screen to motivate her. She denies smoking regularly. She smokes occassionally and last smoked 1 week ago. She wants to know how long it stays in your system to help motivate her to quit. She denies using any other drugs.   Review of Systems  History and Problem List: Kayde has Obesity; Routine screening for STI (sexually transmitted infection); and Adjustment disorder with mixed anxiety and depressed mood on her problem list.  Jaylean  has a past medical history of Obesity.  Immunizations needed: none     Objective:    BP 122/86 mmHg  Wt 310 lb 12.8 oz (140.978 kg) Physical Exam  Constitutional:  Obese 20 year old female  Cardiovascular: Normal rate and regular rhythm.   Psychiatric:  Diffusely dry skin with chronic eczema on arms, back, chest, and specifically more sever at the site of her jeans and button.       Assessment and Plan:   Niyati is a 20 y.o. old female with eczema.  1. Eczema -reviewed daily skin care and need for nickel avoidance - triamcinolone ointment (KENALOG) 0.5 %; Apply 1 application topically 2 (two) times daily. For moderate to severe eczema.  Do not use for more than 1 week at a time.  Dispense: 60 g; Refill: 3  2. Nickel allergy Avoidance reviewed  3. Drug use Per patient request. - Drug Screen, Urine    No Follow-up on file.  Patient to transition to adult health next month.  Jairo BenMCQUEEN,Malissia Rabbani D, MD

## 2015-09-16 NOTE — Patient Instructions (Signed)
Avoid nickel exposure to the skin Basic Skin Care Your child's skin plays an important role in keeping the entire body healthy.  Below are some tips on how to try and maximize skin health from the outside in.  1) Bathe in mildly warm water every 1 to 3 days, followed by light drying and an application of a thick moisturizer cream or ointment, preferably one that comes in a tub. a. Fragrance free moisturizing bars or body washes are preferred such as Purpose, Cetaphil, Dove sensitive skin, Aveeno, [employer] Baby or Vanicream products. b. Use a fragrance free cream or ointment, not a lotion, such as plain petroleum jelly or Vaseline ointment, Aquaphor, Vanicream, Eucerin cream or a generic version, CeraVe Cream, Cetaphil Restoraderm, Aveeno Eczema Therapy and [employer] Baby Calming, among others. c. Children with very dry skin often need to put on these creams two, three or four times a day.  As much as possible, use these creams enough to keep the skin from looking dry. d. Consider using fragrance free/dye free detergent, such as Arm and Hammer for sensitive skin, Tide Free or All Free.   2) If I am prescribing a medication to go on the skin, the medicine goes on first to the areas that need it, followed by a thick cream as above to the entire body.  3) Wynelle LinkSun is a major cause of damage to the skin. a. I recommend sun protection for all of my patients. I prefer physical barriers such as hats with wide brims that cover the ears, long sleeve clothing with SPF protection including rash guards for swimming. These can be found seasonally at outdoor clothing companies, [employer] and [employer] and online at [employer]www.coolibar.com, www.uvskinz.com and BrideEmporium.nlwww.sunprecautions.com. Avoid peak sun between the hours of 10am to 3pm to minimize sun exposure.  b. I recommend sunscreen for all of my patients older than 346 months of age when in the sun, preferably with broad spectrum coverage and SPF 30 or higher.  i. For children, I  recommend sunscreens that only contain titanium dioxide and/or zinc oxide in the active ingredients. These do not burn the eyes and appear to be safer than chemical sunscreens. These sunscreens include zinc oxide paste found in the diaper section, Vanicream Broad Spectrum 50+, Aveeno Natural Mineral Protection, Neutrogena Pure and Free Baby, Johnson and MotorolaJohnson Baby Daily face and body lotion, [employer] Baby products, among others. ii. There is no such thing as waterproof sunscreen. All sunscreens should be reapplied after 60-80 minutes of wear.  iii. Spray on sunscreens often use chemical sunscreens which do protect against the sun. However, these can be difficult to apply correctly, especially if wind is present, and can be more likely to irritate the skin.  Long term effects of chemical sunscreens are also not fully known.        This is an example of a gentle detergent for washing clothes and bedding.     These are examples of after bath moisturizers. Use after lightly patting the skin but the skin still wet.    This is the most gentle soap to use on the skin.

## 2015-09-17 ENCOUNTER — Telehealth: Payer: Self-pay

## 2015-09-17 LAB — DRUG SCREEN, URINE
AMPHETAMINE SCRN UR: NEGATIVE
BARBITURATE QUANT UR: NEGATIVE
BENZODIAZEPINES.: NEGATIVE
COCAINE METABOLITES: NEGATIVE
Creatinine,U: 223.65 mg/dL
Marijuana Metabolite: POSITIVE — AB
Methadone: NEGATIVE
Opiates: NEGATIVE
Phencyclidine (PCP): NEGATIVE
Propoxyphene: NEGATIVE

## 2015-09-17 NOTE — Telephone Encounter (Signed)
Pt called Dr. Jenne CampusMcqueen, returning her call for results.

## 2015-09-18 ENCOUNTER — Encounter: Payer: Self-pay | Admitting: Pediatrics

## 2015-09-18 NOTE — Telephone Encounter (Signed)
Pt called back requesting a nurse call to get her blood results. Pt stated she needs this results by tomorrow.

## 2015-09-18 NOTE — Telephone Encounter (Signed)
called pt back to report her of the drug screen results. Informed pt that lab was positive for Marijuana, explained that this drug stays in the urine for 2 months. Also informed her that Dr. Jenne CampusMcQueen is recommending drug counseling if pt is willing to quit. Pt stated that she is not addicted and she does not use it that often. Advised pt to call us if she feels like she needs to talk to someone about this issue. Pt thanks us for the call back.

## 2015-10-01 ENCOUNTER — Encounter: Payer: Self-pay | Admitting: Family

## 2015-10-01 ENCOUNTER — Ambulatory Visit (INDEPENDENT_AMBULATORY_CARE_PROVIDER_SITE_OTHER): Payer: Medicaid Other | Admitting: Family

## 2015-10-01 VITALS — BP 131/91 | HR 96 | Ht 67.8 in | Wt 310.6 lb

## 2015-10-01 DIAGNOSIS — Z3049 Encounter for surveillance of other contraceptives: Secondary | ICD-10-CM | POA: Diagnosis not present

## 2015-10-01 DIAGNOSIS — Z3202 Encounter for pregnancy test, result negative: Secondary | ICD-10-CM | POA: Diagnosis not present

## 2015-10-01 DIAGNOSIS — Z30017 Encounter for initial prescription of implantable subdermal contraceptive: Secondary | ICD-10-CM

## 2015-10-01 DIAGNOSIS — Z113 Encounter for screening for infections with a predominantly sexual mode of transmission: Secondary | ICD-10-CM

## 2015-10-01 LAB — POCT URINE PREGNANCY: Preg Test, Ur: NEGATIVE

## 2015-10-01 LAB — POCT RAPID HIV: Rapid HIV, POC: NEGATIVE

## 2015-10-01 MED ORDER — ETONOGESTREL 68 MG ~~LOC~~ IMPL
68.0000 mg | DRUG_IMPLANT | Freq: Once | SUBCUTANEOUS | Status: AC
  Administered 2015-10-01: 68 mg via SUBCUTANEOUS

## 2015-10-01 NOTE — Progress Notes (Signed)
THIS RECORD MAY CONTAIN CONFIDENTIAL INFORMATION THAT SHOULD NOT BE RELEASED WITHOUT REVIEW OF THE SERVICE PROVIDER.  Adolescent Medicine Consultation Follow-Up Visit Carrie Allen  is a 20 y.o. female referred by Kalman JewelsMcQueen, Shannon, MD here today for follow-up.    Previsit planning completed:  yes  Growth Chart Viewed? yes   History was provided by the patient.  PCP Confirmed?  yes   HPI:     Here for nexplanon. Had one before but got it taken out for irregular bleeding  Hasn't been on any birth control since Sept/Oct Felt like they were making her "bipolar" mood changed a lot. Was affecting relationship. Lots of fights. This is better since stopped them Has been having sex. Has had a period. Last unprotected sex in February Not using condoms Has only had one partner in last year. Just broke up Wants to be on something before medicaid runs out this month  Doing better since got a job. Works at [employer] as Conservation officer, naturecashier  No other complaints on ROS   Patient's last menstrual period was 09/12/2015. No Known Allergies Outpatient Prescriptions Prior to Visit  Medication Sig Dispense Refill  . triamcinolone ointment (KENALOG) 0.5 % Apply 1 application topically 2 (two) times daily. For moderate to severe eczema.  Do not use for more than 1 week at a time. 60 g 3  . norethindrone-ethinyl estradiol-iron (MICROGESTIN FE,GILDESS FE,LOESTRIN FE) 1.5-30 MG-MCG tablet Take 1 tablet by mouth daily. (Patient not taking: Reported on 09/16/2015) 1 Package 11  . Prenatal Vit-Fe Fumarate-FA (PRENATAL VITAMINS PLUS) 27-1 MG TABS Take 1 tablet by mouth daily. (Patient not taking: Reported on 10/01/2015) 30 tablet 11   No facility-administered medications prior to visit.     Patient Active Problem List   Diagnosis Date Noted  . Eczema 09/16/2015  . 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 Adjustment disorder with mixed anxiety and depressed mood 04/03/2015  . Obesity     Social History: Working at  [employer]. Doing well  Confidentiality was discussed with the patient and if applicable, with caregiver as well.  Tobacco?  yes, sometimes Drugs/ETOH?  yes, occasional alcohol. Marijuana occasionally. Last cocaine use one year ago Partner preference?  both Sexually Active?  yes, last February   Pregnancy Prevention:  interested in nexplanon today. currently no method, reviewed condoms & plan B Suicidal or Self-Harm thoughts?   no, last SI one year ago per patient    The following portions of the patient's history were reviewed and updated as appropriate: allergies, current medications, past medical history, past social history, past surgical history and problem list.  Physical Exam:  Filed Vitals:   10/01/15 1015  BP: 131/91  Pulse: 96  Height: 5' 7.79" (1.722 m)  Weight: 310 lb 9.6 oz (140.887 kg)   BP 131/91 mmHg  Pulse 96  Ht 5' 7.79" (1.722 m)  Wt 310 lb 9.6 oz (140.887 kg)  BMI 47.51 kg/m2  LMP 09/12/2015 Body mass index: body mass index is 47.51 kg/(m^2). Facility age limit for growth percentiles is 20 years.  Physical Exam  Constitutional: She appears well-developed and well-nourished. No distress.  obese  HENT:  Head: Normocephalic and atraumatic.  Nose: Nose normal.  Mouth/Throat: Oropharynx is clear and moist. No oropharyngeal exudate.  Eyes: Conjunctivae and EOM are normal. Pupils are equal, round, and reactive to light. Right eye exhibits no discharge. Left eye exhibits no discharge. No scleral icterus.  Neck: Normal range of motion. Neck supple.  Cardiovascular: Normal rate, regular rhythm,  normal heart sounds and intact distal pulses.  Exam reveals no gallop and no friction rub.   No murmur heard. Pulmonary/Chest: Effort normal and breath sounds normal. No respiratory distress. She has no wheezes. She has no rales.  Abdominal: Soft. Bowel sounds are normal. She exhibits no distension. There is no tenderness. There is no rebound and no guarding.   Musculoskeletal: Normal range of motion. She exhibits no edema or tenderness.  Neurological: She is alert.  Skin: Skin is warm. No rash noted. She is not diaphoretic. No erythema. No pallor.  Psychiatric: She has a normal mood and affect.  Nursing note and vitals reviewed.    Assessment/Plan:  1. Insertion of Nexplanon Inserted nexplanon. See procedure note. Tolerated well and easily palpated after insertion - etonogestrel (NEXPLANON) implant 68 mg; 68 mg by Subdermal route once. - Subdermal Etonogestrel Implant Insertion; Standing - Subdermal Etonogestrel Implant Insertion  2. Routine screening for STI (sexually transmitted infection) - POCT Rapid HIV: negative  3. Pregnancy examination or test, negative result - POCT urine pregnancy   Follow-up:  Return in about 1 month (around 11/01/2015) for follow up.   Medical decision-making:  > 30 minutes spent, more than 50% of appointment was spent discussing diagnosis and management of symptoms  Antania Hoefling [place], MD [facility] Resident, PGY3

## 2015-10-01 NOTE — Progress Notes (Signed)
Patient ID: Carrie Allen, female   DOB: 02-Apr-1996, 20 y.o.   MRN: [id number] Pre-Visit Planning  Carrie Allen  is a 20 y.o. female referred by Carrie BenMCQUEEN,SHANNON Allen, Carrie Allen.   Last seen in [facility] on 05/06/15 for OCPs for BC, anemia, elevated BP.  Plan at last visit included continue OCPs, follow up with PCP for BP.  Date and Type of Previous Psych Screenings? NA  Clinical Staff Visit Tasks:   - Urine GC/CT due? no - HIV Screening due?  yes - Psych Screenings Due? No - BP was elevated at last OV   Provider Visit Tasks: - assess OCP use, condom use, update sexual hx  - [facility] Involvement? No - Pertinent Labs? No  >3 minutes spent reviewing records and planning for patient's visit.

## 2015-10-01 NOTE — Procedures (Signed)
Nexplanon Insertion  No contraindications for placement.  No liver disease, no unexplained vaginal bleeding, no h/o breast cancer, no h/o blood clots.  Patient's last menstrual period was 09/12/2015.  UHCG: negative  Last Unprotected sex:  February 2017  Risks & benefits of Nexplanon discussed The nexplanon device was purchased and supplied by [facility]. Packaging instructions supplied to patient Consent form signed  The patient denies any allergies to anesthetics or antiseptics.  Procedure: Pt was placed in supine position. The left arm was flexed at the elbow and externally rotated so that her wrist was parallel to her ear The medial epicondyle of the left arm was identified The insertions site was marked 8 cm proximal to the medial epicondyle The insertion site was cleaned with Betadine The area surrounding the insertion site was covered with a sterile drape 1% lidocaine was injected just under the skin at the insertion site extending 4 cm proximally. The sterile preloaded disposable Nexaplanon applicator was removed from the sterile packaging The applicator needle was inserted at a 30 degree angle at 8 cm proximal to the medial epicondyle as marked The applicator was lowered to a horizontal position and advanced just under the skin for the full length of the needle The slider on the applicator was retracted fully while the applicator remained in the same position, then the applicator was removed. The implant was confirmed via palpation as being in position The implant position was demonstrated to the patient Pressure dressing was applied to the patient.  The patient was instructed to removed the pressure dressing in 24 hrs.  The patient was advised to move slowly from a supine to an upright position  The patient denied any concerns or complaints  The patient was instructed to schedule a follow-up appt in 1 month and to call sooner if any concerns.  The patient acknowledged  agreement and understanding of the plan.

## 2015-10-01 NOTE — Patient Instructions (Signed)
Follow-up with Dr. Perry in 1 month. Schedule this appointment before you leave clinic today.  Congratulations on getting your Nexplanon placement!  Below is some important information about Nexplanon.  First remember that Nexplanon does not prevent sexually transmitted infections.  Condoms will help prevent sexually transmitted infections. The Nexplanon starts working 7 days after it was inserted.  There is a risk of getting pregnant if you have unprotected sex in those first 7 days after placement of the Nexplanon.  The Nexplanon lasts for 3 years but can be removed at any time.  You can become pregnant as early as 1 week after removal.  You can have a new Nexplanon put in after the old one is removed if you like.  It is not known whether Nexplanon is as effective in women who are very overweight because the studies did not include many overweight women.  Nexplanon interacts with some medications, including barbiturates, bosentan, carbamazepine, felbamate, griseofulvin, oxcarbazepine, phenytoin, rifampin, St. John's wort, topiramate, HIV medicines.  Please alert your doctor if you are on any of these medicines.  Always tell other healthcare providers that you have a Nexplanon in your arm.  The Nexplanon was placed just under the skin.  Leave the outside bandage on for 24 hours.  Leave the smaller bandage on for 3-5 days or until it falls off on its own.  Keep the area clean and dry for 3-5 days. There is usually bruising or swelling at the insertion site for a few days to a week after placement.  If you see redness or pus draining from the insertion site, call us immediately.  Keep your user card with the date the implant was placed and the date the implant is to be removed.  The most common side effect is a change in your menstrual bleeding pattern.   This bleeding is generally not harmful to you but can be annoying.  Call or come in to see us if you have any concerns about the bleeding or if  you have any side effects or questions.    We will call you in 1 week to check in and we would like you to return to the clinic for a follow-up visit in 1 month.  You can call [facility] 24 hours a day with any questions or concerns.  There is always a nurse or doctor available to take your call.  Call 9-1-1 if you have a life-threatening emergency.  For anything else, please call us at [phone] before heading to the ER.  

## 2015-10-03 ENCOUNTER — Ambulatory Visit (INDEPENDENT_AMBULATORY_CARE_PROVIDER_SITE_OTHER): Payer: Medicaid Other | Admitting: Pediatrics

## 2015-10-03 ENCOUNTER — Other Ambulatory Visit: Payer: Self-pay | Admitting: Pediatrics

## 2015-10-03 ENCOUNTER — Encounter: Payer: Self-pay | Admitting: Pediatrics

## 2015-10-03 VITALS — BP 120/90 | HR 104 | Temp 99.4°F | Ht 66.93 in | Wt 310.8 lb

## 2015-10-03 DIAGNOSIS — H1013 Acute atopic conjunctivitis, bilateral: Secondary | ICD-10-CM

## 2015-10-03 DIAGNOSIS — J3089 Other allergic rhinitis: Secondary | ICD-10-CM

## 2015-10-03 DIAGNOSIS — H1010 Acute atopic conjunctivitis, unspecified eye: Secondary | ICD-10-CM

## 2015-10-03 DIAGNOSIS — J302 Other seasonal allergic rhinitis: Secondary | ICD-10-CM | POA: Insufficient documentation

## 2015-10-03 HISTORY — DX: Acute atopic conjunctivitis, unspecified eye: H10.10

## 2015-10-03 MED ORDER — FLUTICASONE PROPIONATE 50 MCG/ACT NA SUSP: NASAL | Status: DC

## 2015-10-03 MED ORDER — CETIRIZINE HCL 10 MG PO TABS: ORAL_TABLET | ORAL | Status: DC

## 2015-10-03 MED ORDER — OLOPATADINE HCL 0.2 % OP SOLN: OPHTHALMIC | Status: DC

## 2015-10-03 NOTE — Progress Notes (Signed)
Subjective:     Patient ID: Carrie Allen, female   DOB: 09-Oct-1995, 19 y.o.   MRN: [id number]  HPI:  20 year old female in by herself.  She has been having nasal congestion and cough for several days.  Last night she started having pain in her upper chest with her cough which is loose. Since her symptoms began 7 days ago she has vomited phlegm twice.  She thinks she probably has year-round allergies because she sneezes "all the time".  Nose and eyes are itchy.  Throat feels itchy too and sometimes her ears are stopped up. Denies fever or diarrhea.  Was here 2 days ago and had Nexplanon placed in arm.  Recent labs for STI were negative as was UPT. Review of Systems  Constitutional: Negative for fever, activity change and appetite change.  HENT: Positive for congestion, postnasal drip, rhinorrhea, sneezing and sore throat. Negative for ear pain.   Eyes: Positive for itching. Negative for redness and visual disturbance.  Respiratory: Positive for cough. Negative for shortness of breath.   Cardiovascular: Positive for chest pain.  Gastrointestinal: Positive for nausea and vomiting. Negative for diarrhea.       Objective:   Physical Exam  Constitutional:  Morbidly obese young adult  HENT:  Mildly inflamed and swollen turbinates.  Blister-like post pharynx. TM's normal  Eyes:  Allergic shiners with [employer]enny[employer] lines.  Granular conjunctivae  Neck: Neck supple.  Cardiovascular: Normal rate and normal heart sounds.   No murmur heard. Pulmonary/Chest: Effort normal and breath sounds normal. She has no wheezes. She has no rales. She exhibits no tenderness.  Lymphadenopathy:    She has no cervical adenopathy.  Nursing note and vitals reviewed.      Assessment:     AR with congestion and cough Allergic conjunctivitis     Plan:     Rx per orders for Cetirizine, Fluticasone nasal spray and Pataday  Discussed indoor and outdoor allergens and how to avoid.  Report worsening symptoms,  fever, or worsening chest symptoms   Gregor HamsJacqueline Audri Kozub, PPCNP-BC

## 2015-10-03 NOTE — Patient Instructions (Addendum)
Allergic Rhinitis Allergic rhinitis is when the mucous membranes in the nose respond to allergens. Allergens are particles in the air that cause your body to have an allergic reaction. This causes you to release allergic antibodies. Through a chain of events, these eventually cause you to release histamine into the blood stream. Although meant to protect the body, it is this release of histamine that causes your discomfort, such as frequent sneezing, congestion, and an itchy, runny nose.  CAUSES Seasonal allergic rhinitis (hay fever) is caused by pollen allergens that may come from grasses, trees, and weeds. Year-round allergic rhinitis (perennial allergic rhinitis) is caused by allergens such as house dust mites, pet dander, and mold spores. SYMPTOMS  Nasal stuffiness (congestion).  Itchy, runny nose with sneezing and tearing of the eyes. DIAGNOSIS Your health care provider can help you determine the allergen or allergens that trigger your symptoms. If you and your health care provider are unable to determine the allergen, skin or blood testing may be used. Your health care provider will diagnose your condition after taking your health history and performing a physical exam. Your health care provider may assess you for other related conditions, such as asthma, pink eye, or an ear infection. TREATMENT Allergic rhinitis does not have a cure, but it can be controlled by:  Medicines that block allergy symptoms. These may include allergy shots, nasal sprays, and oral antihistamines.  Avoiding the allergen. Hay fever may often be treated with antihistamines in pill or nasal spray forms. Antihistamines block the effects of histamine. There are over-the-counter medicines that may help with nasal congestion and swelling around the eyes. Check with your health care provider before taking or giving this medicine. If avoiding the allergen or the medicine prescribed do not work, there are many new medicines  your health care provider can prescribe. Stronger medicine may be used if initial measures are ineffective. Desensitizing injections can be used if medicine and avoidance does not work. Desensitization is when a patient is given ongoing shots until the body becomes less sensitive to the allergen. Make sure you follow up with your health care provider if problems continue. HOME CARE INSTRUCTIONS It is not possible to completely avoid allergens, but you can reduce your symptoms by taking steps to limit your exposure to them. It helps to know exactly what you are allergic to so that you can avoid your specific triggers. SEEK MEDICAL CARE IF:  You have a fever.  You develop a cough that does not stop easily (persistent).  You have shortness of breath.  You start wheezing.  Symptoms interfere with normal daily activities.   This information is not intended to replace advice given to you by your health care provider. Make sure you discuss any questions you have with your health care provider.   Document Released: 02/10/2001 Document Revised: 06/08/2014 Document Reviewed: 01/23/2013 Elsevier Interactive Patient Education 2016 [employer]    Allergic Conjunctivitis Allergic conjunctivitis is inflammation of the clear membrane that covers the white part of your eye and the inner surface of your eyelid (conjunctiva), and it is caused by allergies. The blood vessels in the conjunctiva become inflamed, and this causes the eye to become red or pink, and it often causes itchiness in the eye. Allergic conjunctivitis cannot be spread by one person to another person (noncontagious). CAUSES This condition is caused by an allergic reaction. Common causes of an allergic reaction (allergens) include:  Dust.  Pollen.  Mold.  Animal dander or secretions. RISK FACTORS  This condition is more likely to develop if you are exposed to high levels of allergens that cause the allergic reaction. This might  include being outdoors when air pollen levels are high or being around animals that you are allergic to. SYMPTOMS Symptoms of this condition may include:  Eye redness.  Tearing of the eyes.  Watery eyes.  Itchy eyes.  Burning feeling in the eyes.  Clear drainage from the eyes.  Swollen eyelids. DIAGNOSIS This condition may be diagnosed by medical history and physical exam. If you have drainage from your eyes, it may be tested to rule out other causes of conjunctivitis. TREATMENT Treatment for this condition often includes medicines. These may be eye drops, ointments, or oral medicines. They may be prescription medicines or over-the-counter medicines. HOME CARE INSTRUCTIONS  Take or apply medicines only as directed by your health care provider.  Do not touch or rub your eyes.  Do not wear contact lenses until the inflammation is gone. Wear glasses instead.  Do not wear eye makeup until the inflammation is gone.  Apply a cool, clean washcloth to your eye for 10-20 minutes, 3-4 times a day.  Try to avoid whatever allergen is causing the allergic reaction. SEEK MEDICAL CARE IF:  Your symptoms get worse.  You have pus draining from your eye.  You have new symptoms.  You have a fever.   This information is not intended to replace advice given to you by your health care provider. Make sure you discuss any questions you have with your health care provider.   Document Released: 08/08/2002 Document Revised: 06/08/2014 Document Reviewed: 02/27/2014 Elsevier Interactive Patient Education [employer]

## 2015-10-08 ENCOUNTER — Encounter: Payer: Self-pay | Admitting: Pediatrics

## 2015-10-08 ENCOUNTER — Ambulatory Visit (INDEPENDENT_AMBULATORY_CARE_PROVIDER_SITE_OTHER): Payer: Medicaid Other | Admitting: Pediatrics

## 2015-10-08 VITALS — HR 100 | Temp 96.7°F | Wt 311.2 lb

## 2015-10-08 DIAGNOSIS — K219 Gastro-esophageal reflux disease without esophagitis: Secondary | ICD-10-CM

## 2015-10-08 MED ORDER — OMEPRAZOLE 20 MG PO CPDR: 20.0000 mg | DELAYED_RELEASE_CAPSULE | Freq: Every day | ORAL | Status: DC

## 2015-10-08 NOTE — Progress Notes (Signed)
History was provided by the patient.  Carrie Allen is a 20 y.o. female who is here for cough, congestion, and nausea and vomiting.     HPI:   Patient reports congestion, nasal drainage, and cough for the past week or so.  Recently seen by doctor on 5/4 for similar symptoms, and was prescribed Zyrtec, Flonase, and antihistamine eye drops.  Reports tightness/pain in chest that only occurs when she coughs.  She says that since the last visit, her symptoms have worsened.  Reports feeling nausea with a few episodes of emesis, and reported feeling achy after the emesis.  Last episode of emesis was Sunday 5/7.  Does say that coughing up a lot of mucus has made her feel ill.    The following portions of the patient's history were reviewed and updated as appropriate: allergies, current medications, past family history, past medical history, past social history, past surgical history and problem list.  Physical Exam:  Pulse 100  Temp(Src) 96.7 F (35.9 C) (Temporal)  Wt 311 lb 3.2 oz (141.159 kg)  SpO2 98%  LMP 09/12/2015  Facility age limit for growth percentiles is 20 years. Patient's last menstrual period was 09/12/2015.    General:   alert, cooperative, appears stated age and obese female, in no distress     Skin:   normal  Oral cavity:   lips, mucosa, and tongue normal; teeth and gums normal  Eyes:   sclerae white, pupils equal and reactive  Ears:   normal bilaterally  Nose: clear discharge, turbinates erythematous  Neck:  Neck appearance: Normal  Lungs:  clear to auscultation bilaterally  Heart:   regular rate and rhythm, S1, S2 normal, no murmur, click, rub or gallop   Abdomen:  soft, non-tender; bowel sounds normal; no masses,  no organomegaly  GU:  not examined  Extremities:   extremities normal, atraumatic, no cyanosis or edema  Neuro:  normal without focal findings, mental status, speech normal, alert and oriented x3 and PERLA    Assessment/Plan:  Carrie Allen is a 20 year  old female here with congestion, cough, and runny nose.  These symptoms have been ongoing for over a week now, also reporting some episodes of NV and increased amounts of burping.  Patient's symptoms of intermittent nausea, vomiting, and increased burping, likely related to GERD.   - Recommend trial of Omeprazole for one month. - Recommend continuing the Flonase, Zyrtec,  - Follow-up visit in 1 month for check up on her reflux, or sooner as needed.    Demetrios LollMatthew Elese Rane, MD  10/08/2015

## 2015-10-08 NOTE — Progress Notes (Signed)
I personally saw and evaluated the patient, and participated in the management and treatment plan as documented in the resident's note.  Consuella LoseKINTEMI, Steffani Dionisio-KUNLE B 10/08/2015 9:17 PM

## 2015-10-08 NOTE — Patient Instructions (Addendum)
Gastroesophageal Reflux Disease Normally, food travels down the esophagus and stays in the stomach to be digested. If a person has gastroesophageal reflux disease (GERD), food and stomach acid move back up into the esophagus. When this happens, the esophagus becomes sore and swollen (inflamed). Over time, GERD can make small holes (ulcers) in the lining of the esophagus. HOME CARE Diet  Follow a diet as told by your doctor. You may need to avoid foods and drinks such as:  Coffee and tea (with or without caffeine).  Drinks that contain alcohol.  Energy drinks and sports drinks.  Carbonated drinks or sodas.  Chocolate and cocoa.  Peppermint and mint flavorings.  Garlic and onions.  Horseradish.  Spicy and acidic foods, such as peppers, chili powder, curry powder, vinegar, hot sauces, and BBQ sauce.  Citrus fruit juices and citrus fruits, such as oranges, lemons, and limes.  Tomato-based foods, such as red sauce, chili, salsa, and pizza with red sauce.  Fried and fatty foods, such as donuts, french fries, potato chips, and high-fat dressings.  High-fat meats, such as hot dogs, rib eye steak, sausage, ham, and bacon.  High-fat dairy items, such as whole milk, butter, and cream cheese.  Eat small meals often. Avoid eating large meals.  Avoid drinking large amounts of liquid with your meals.  Avoid eating meals during the 2-3 hours before bedtime.  Avoid lying down right after you eat.  Do not exercise right after you eat. General Instructions  Pay attention to any changes in your symptoms.  Take over-the-counter and prescription medicines only as told by your doctor. Do not take aspirin, ibuprofen, or other NSAIDs unless your doctor says it is okay.  Do not use any tobacco products, including cigarettes, chewing tobacco, and e-cigarettes. If you need help quitting, ask your doctor.  Wear loose clothes. Do not wear anything tight around your waist.  Raise (elevate)  the head of your bed about 6 inches (15 cm).  Try to lower your stress. If you need help doing this, ask your doctor.  If you are overweight, lose an amount of weight that is healthy for you. Ask your doctor about a safe weight loss goal.  Keep all follow-up visits as told by your doctor. This is important. GET HELP IF:  You have new symptoms.  You lose weight and you do not know why it is happening.  You have trouble swallowing, or it hurts to swallow.  You have wheezing or a cough that keeps happening.  Your symptoms do not get better with treatment.  You have a hoarse voice. GET HELP RIGHT AWAY IF:  You have pain in your arms, neck, jaw, teeth, or back.  You feel sweaty, dizzy, or light-headed.  You have chest pain or shortness of breath.  You throw up (vomit) and your throw up looks like blood or coffee grounds.  You pass out (faint).  Your poop (stool) is bloody or black.  You cannot swallow, drink, or eat.   This information is not intended to replace advice given to you by your health care provider. Make sure you discuss any questions you have with your health care provider.   Document Released: 11/04/2007 Document Revised: 02/06/2015 Document Reviewed: 09/12/2014 Elsevier Interactive Patient Education [employer]2016 Elsevier Inc.

## 2015-10-09 ENCOUNTER — Other Ambulatory Visit: Payer: Self-pay | Admitting: Pediatrics

## 2015-10-09 NOTE — Telephone Encounter (Signed)
Carrie Allen called stating if Doctor can write her any cough medication.  Patient was here yesterday. 10/08/2015

## 2015-10-10 ENCOUNTER — Ambulatory Visit (INDEPENDENT_AMBULATORY_CARE_PROVIDER_SITE_OTHER): Payer: Medicaid Other | Admitting: Pediatrics

## 2015-10-10 ENCOUNTER — Encounter: Payer: Self-pay | Admitting: Pediatrics

## 2015-10-10 ENCOUNTER — Ambulatory Visit (INDEPENDENT_AMBULATORY_CARE_PROVIDER_SITE_OTHER): Payer: Medicaid Other | Admitting: Clinical

## 2015-10-10 VITALS — HR 91 | Temp 97.9°F | Wt 308.8 lb

## 2015-10-10 DIAGNOSIS — E669 Obesity, unspecified: Secondary | ICD-10-CM | POA: Diagnosis not present

## 2015-10-10 DIAGNOSIS — Z596 Low income: Secondary | ICD-10-CM

## 2015-10-10 DIAGNOSIS — F4323 Adjustment disorder with mixed anxiety and depressed mood: Secondary | ICD-10-CM

## 2015-10-10 DIAGNOSIS — R05 Cough: Secondary | ICD-10-CM | POA: Diagnosis not present

## 2015-10-10 DIAGNOSIS — G479 Sleep disorder, unspecified: Secondary | ICD-10-CM | POA: Diagnosis not present

## 2015-10-10 DIAGNOSIS — K219 Gastro-esophageal reflux disease without esophagitis: Secondary | ICD-10-CM

## 2015-10-10 DIAGNOSIS — R053 Chronic cough: Secondary | ICD-10-CM

## 2015-10-10 DIAGNOSIS — J3089 Other allergic rhinitis: Secondary | ICD-10-CM | POA: Diagnosis not present

## 2015-10-10 DIAGNOSIS — G4733 Obstructive sleep apnea (adult) (pediatric): Secondary | ICD-10-CM | POA: Insufficient documentation

## 2015-10-10 NOTE — Telephone Encounter (Signed)
Pt called this morning to check on her message, she still complaining of coughing a lot to the point she is not able to breath.

## 2015-10-10 NOTE — Patient Instructions (Signed)

## 2015-10-10 NOTE — BH Specialist Note (Signed)
Primary Care Provider: Jairo BenMCQUEEN,SHANNON D, MD  Referring Provider: Edwena FeltyHADDIX, WHITNEY, MD Session Time:  1430 - 1454 (24 min) Type of Service: Behavioral Health - Individual/Family Interpreter: No.  Interpreter Name & Language: N/A # [facility]-June 2017: 7th Joint visit with Carrie Allen, Carrie Allen with consent from pt  PRESENTING CONCERNS:  Carrie Allen is a 20 y.o. female brought in by self and her friend. [facility] stressors regarding access to healthcare.  Carrie Allen presented for multiple health concerns [facility]  Carrie Allen also reported concerns with the loss of full coverage of her medical insurance when she turns 20.   GOALS ADDRESSED:  Increase utilization of positive coping strategies. Increase access to health care services by exploring all opportunities for coverage.   INTERVENTIONS:  Identified community resources and support systems in place for transitioning patients from pediatrics to adults. Identified positive coping strategies.   ASSESSMENT/OUTCOME:  [name] presented to be casually dressed and highly anxious about the upcoming loss of her medical insurance coverage.  Carrie Allen reported she will still have coverage for family planning.  Carrie Allen acknowledged understanding [facility] to [facility] additional opportunities for insurance coverage.  Carrie Allen informed about [place] adult practices in the areas and support to assess her insurance eligibility.   TREATMENT PLAN:  Carrie Allen will follow up with an adult primary care practice of her choice and follow up with support system regarding her insurance coverage.   [facility], although it was offered.    Carrie Allen wanted to focus on scheduling sleep study at this time.  Carrie Allen Behavioral Health Clinician [facility]

## 2015-10-10 NOTE — Progress Notes (Signed)
History was provided by the patient.  Carrie Allen is a 20 y.o. female who is here for persistent cough.     HPI:    Patient initially seen on 5/4 with congestion, cough and prescribed flonase, Zyrtec, and anithistamine eye drops for improvement of allergic rhinitis.  She returned on 5/9 due to continued symptoms of cough, also reporting some tightness/discomfort in her chest with the cough.  She also endorsed increased burping, and she was prescribed omeprazole to treat GERD.  Currently, reporting continued issues with cough, congestion, and .  Endorses dry cough at night and says it keeps her up at night.  Two episodes of post-tussive emesis this morning.  Patient said her grandparents say she snores at night and has episodes where it sounds like she stops breathing.  Taking the cetirizine regularly, but has stopped taking the Flonase.  Took omeprazole this morning.  Patient admitted that she has been taking the medicines at varying schedules (but unclear if she's missed any doses).  The following portions of the patient's history were reviewed and updated as appropriate: allergies, current medications, past family history, past medical history, past social history, past surgical history and problem list.  Physical Exam:  Pulse 91  Temp(Src) 97.9 F (36.6 C) (Temporal)  Wt 308 lb 12.8 oz (140.071 kg)  SpO2 99%  LMP 09/12/2015  Facility age limit for growth percentiles is 20 years. Patient's last menstrual period was 09/12/2015.    General:   alert, cooperative, appears stated age and obese female, in no distress     Skin:   normal  Oral cavity:   normal findings: lips normal without lesions, teeth intact, non-carious and palate normal and with mild erythema of oropharynx  Eyes:   sclerae white, pupils equal and reactive  Ears:   normal bilaterally  Nose: clear, no discharge  Neck:  Neck appearance: thick  Lungs:  clear to auscultation bilaterally, but distant breath sounds  given body habitus  Heart:   regular rate and rhythm, S1, S2 normal, no murmur, click, rub or gallop   Abdomen:  soft, non-tender; bowel sounds normal; no masses,  no organomegaly  GU:  not examined  Extremities:   extremities normal, atraumatic, no cyanosis or edema  Neuro:  normal without focal findings, mental status, speech normal, alert and oriented x3 and PERLA    Assessment/Plan: Carrie Allen is a 20 year old female who returns for continued issues with her persistent cough.  Now reporting that family members are concerned about her snoring at night and that she has what sound like apneic episodes.  She has already been initiated on therapy for allergic rhinitis and GERD, but unclear how consistent she has been with medication adherence (and the omeprazole was just initiated a few days ago).  On exam, no changes, no focal consolidations or findings suggesting an underlying infection.  I believe she also has sleep apnea, which is likely responsible for her snoring and other noises at night, and are likely contributing to her fatigue.   We spent a long time discussing the causes of her cough, and that a prescription of cough medication would not fix her underlying issues, and thus we would focus on those things first.  - Will place referral for sleep study - Stressed the importance of continuing her medications, flonase, cetirizine, omeprazole - She is meeting with social worker today, as she will soon need to transition to adult practitioner. - Follow-up visit already planned for 10/22/15. Discussed return precautions  with her, and that   Demetrios LollMatthew Eean Buss, MD  10/10/2015

## 2015-10-10 NOTE — Telephone Encounter (Signed)
Pt scheduled to be seen in clinic this afternoon.  °

## 2015-10-10 NOTE — Addendum Note (Signed)
Addended by: Edwena FeltyHADDIX, Jerri Hargadon on: 10/10/2015 04:10 PM   Modules accepted: Level of Service

## 2015-10-11 ENCOUNTER — Telehealth: Payer: Self-pay | Admitting: Pediatrics

## 2015-10-11 NOTE — Telephone Encounter (Signed)
Carrie Allen wanted to know about the appointment for the sleep study.  This [facility] with Carrie [facility], Patient Care Coordinator since pt was concerned about getting the appointment before she does not have any more insurance.  Ms. Carrie Allen, [facility], was able [facility] and able to get appointment for 10/22/15 at 8pm.  TC to Carrie Allen and informed her about appointment and that she should be receiving the packet of information in the mail.  Carrie Allen was encouraged to put the appt & add reminder on her phone.

## 2015-10-11 NOTE — Telephone Encounter (Signed)
Xylia would like to get a call back from Carrie Allen.

## 2015-10-15 ENCOUNTER — Ambulatory Visit (HOSPITAL_BASED_OUTPATIENT_CLINIC_OR_DEPARTMENT_OTHER): Payer: Medicaid Other | Attending: Pediatrics | Admitting: Internal Medicine

## 2015-10-15 DIAGNOSIS — G479 Sleep disorder, unspecified: Secondary | ICD-10-CM | POA: Insufficient documentation

## 2015-10-15 DIAGNOSIS — G4733 Obstructive sleep apnea (adult) (pediatric): Secondary | ICD-10-CM | POA: Diagnosis not present

## 2015-10-15 DIAGNOSIS — R0902 Hypoxemia: Secondary | ICD-10-CM | POA: Diagnosis not present

## 2015-10-20 DIAGNOSIS — G479 Sleep disorder, unspecified: Secondary | ICD-10-CM

## 2015-10-20 NOTE — Procedures (Signed)
   Patient Name: Carrie Allen, Carrie Allen Study Date: 10/15/2015 Gender: Female D.O.B: 04-09-1996 Age (years): 19 Referring Provider: Demetrios LollMatthew Waters Height (inches): 67 Interpreting Physician: Carrie Duhamellinton Pavan Bring MD, ABSM Weight (lbs): 308 RPSGT: Ulyess MortSpruill, Vicki BMI: 48 MRN: [id number] Neck Size: 17.00 CLINICAL INFORMATION Sleep Study Type: NPSG Indication for sleep study: Morning Headaches, Obesity, Restless Sleep with Limb Movments, Snoring Epworth Sleepiness Score: 9  SLEEP STUDY TECHNIQUE As per the AASM Manual for the Scoring of Sleep and Associated Events v2.3 (April 2016) with a hypopnea requiring 4% desaturations. The channels recorded and monitored were frontal, central and occipital EEG, electrooculogram (EOG), submentalis EMG (chin), nasal and oral airflow, thoracic and abdominal wall motion, anterior tibialis EMG, snore microphone, electrocardiogram, and pulse oximetry.  MEDICATIONS Patient's medications include: charted for review. Medications self-administered by patient during sleep study : No sleep medicine administered.  SLEEP ARCHITECTURE The study was initiated at 11:01:26 PM and ended at 5:34:09 AM. Sleep onset time was 72.2 minutes and the sleep efficiency was 63.8%. The total sleep time was 250.5 minutes. Stage REM latency was 283.0 minutes. The patient spent 22.96% of the night in stage N1 sleep, 64.47% in stage N2 sleep, 0.40% in stage N3 and 12.18% in REM. Alpha intrusion was absent. Supine sleep was 38.72%. Wake after sleep onset 70 minutes  RESPIRATORY PARAMETERS The overall apnea/hypopnea index (AHI) was 104.0 per hour. There were 55 total apneas, including 54 obstructive, 1 central and 0 mixed apneas. There were 379 hypopneas and 5 RERAs. The AHI during Stage REM sleep was 122.0 per hour. AHI while supine was 97.1 per hour. The mean oxygen saturation was 88.31%. The minimum SpO2 during sleep was 69.00%. Moderate snoring was noted during this study.  CARDIAC  DATA The 2 lead EKG demonstrated sinus rhythm. The mean heart rate was 93.02 beats per minute. Other EKG findings include: None.  LEG MOVEMENT DATA The total PLMS were 0 with a resulting PLMS index of 0.00. Associated arousal with leg movement index was 0.0 .  IMPRESSIONS - Severe obstructive sleep apnea occurred during this study (AHI = 104.0/h). - No significant central sleep apnea occurred during this study (CAI = 0.2/h). - Severe oxygen desaturation was noted during this study (Min O2 = 69.00%). - The patient snored with Moderate snoring volume. - No cardiac abnormalities were noted during this study. - Clinically significant periodic limb movements did not occur during sleep. No significant associated arousals.  DIAGNOSIS - Obstructive Sleep Apnea (327.23 [G47.33 ICD-10]) - Nocturnal Hypoxemia (327.26 [G47.36 ICD-10])  RECOMMENDATIONS - Therapeutic CPAP titration to determine optimal pressure required to alleviate sleep disordered breathing. - Avoid alcohol, sedatives and other CNS depressants that may worsen sleep apnea and disrupt normal sleep architecture. - Sleep hygiene should be reviewed to assess factors that may improve sleep quality. - Weight management and regular exercise should be initiated or continued if appropriate.  Waymon BudgeYOUNG,Bryla Burek D   Diplomate, American Board of Sleep Medicine  ELECTRONICALLY SIGNED ON:  10/20/2015, 2:38 PM [facility] PH: [phone]   FX: [phone] ACCREDITED BY THE AMERICAN ACADEMY OF SLEEP MEDICINE

## 2015-10-21 ENCOUNTER — Other Ambulatory Visit: Payer: Self-pay | Admitting: Pediatrics

## 2015-10-21 ENCOUNTER — Encounter: Payer: Self-pay | Admitting: Pediatrics

## 2015-10-21 DIAGNOSIS — G4733 Obstructive sleep apnea (adult) (pediatric): Secondary | ICD-10-CM

## 2015-10-21 NOTE — Telephone Encounter (Signed)
Carrie Allen called stating If Doctor can't call her today then would prefer to get a call back early int he morning.

## 2015-10-21 NOTE — Telephone Encounter (Signed)
Pt called regarding her sleep study results.

## 2015-10-22 ENCOUNTER — Telehealth: Payer: Self-pay

## 2015-10-22 ENCOUNTER — Encounter (HOSPITAL_BASED_OUTPATIENT_CLINIC_OR_DEPARTMENT_OTHER): Payer: Medicaid Other

## 2015-10-22 ENCOUNTER — Encounter: Payer: Self-pay | Admitting: Family

## 2015-10-22 ENCOUNTER — Ambulatory Visit (INDEPENDENT_AMBULATORY_CARE_PROVIDER_SITE_OTHER): Payer: Medicaid Other | Admitting: Family

## 2015-10-22 VITALS — BP 135/87 | HR 107 | Ht 67.72 in | Wt 307.6 lb

## 2015-10-22 DIAGNOSIS — K219 Gastro-esophageal reflux disease without esophagitis: Secondary | ICD-10-CM

## 2015-10-22 DIAGNOSIS — Z975 Presence of (intrauterine) contraceptive device: Secondary | ICD-10-CM

## 2015-10-22 MED ORDER — OMEPRAZOLE 20 MG PO CPDR: 20.0000 mg | DELAYED_RELEASE_CAPSULE | Freq: Every day | ORAL | Status: DC

## 2015-10-22 NOTE — Progress Notes (Signed)
THIS RECORD MAY CONTAIN CONFIDENTIAL INFORMATION THAT SHOULD NOT BE RELEASED WITHOUT REVIEW OF THE SERVICE PROVIDER.  Adolescent Medicine Consultation Follow-Up Visit Carrie Allen  is a 20 y.o. female referred by Kalman JewelsMcQueen, Shannon, MD here today for follow-up.    Previsit planning completed:  no  Growth Chart Viewed? no   History was provided by the patient.  PCP Confirmed?  Yes, Kalman JewelsShannon McQueen, MD   My Chart Activated?   yes   HPI:    Minimal spotting started a few days ago. Some itching at Nexplanon site, no redness or draining. Doing well. Same partner. No vaginal discharge changes, painful intercourse, lesions or concerning GU symptoms.   Medicaid to run out at end of month - she would like 90-day supply of omeprazole.   Review of Systems  Constitutional: Negative.   HENT: Negative.   Respiratory: Negative for cough.   Cardiovascular: Negative.   Gastrointestinal: Positive for heartburn.  Musculoskeletal: Negative.   Skin: Negative.        Well-healed site L arm, implant palpable   Neurological: Negative.   Psychiatric/Behavioral: Negative.      Patient's last menstrual period was 09/12/2015. No Known Allergies Outpatient Prescriptions Prior to Visit  Medication Sig Dispense Refill  . cetirizine (ZYRTEC) 10 MG tablet Take one tablet at bedtime every day for allergies 30 tablet 11  . fluticasone (FLONASE) 50 MCG/ACT nasal spray 2 sprays each nostril every morning for allergies with congestion 16 g 12  . Olopatadine HCl (PATADAY) 0.2 % SOLN One drop in each eye once a day for allergies 1 Bottle 5  . omeprazole (PRILOSEC) 20 MG capsule Take 1 capsule (20 mg total) by mouth daily. 30 capsule 2  . triamcinolone ointment (KENALOG) 0.5 % Apply 1 application topically 2 (two) times daily. For moderate to severe eczema.  Do not use for more than 1 week at a time. 60 g 3  . Prenatal Vit-Fe Fumarate-FA (PRENATAL VITAMINS PLUS) 27-1 MG TABS Take 1 tablet by mouth daily.  (Patient not taking: Reported on 10/01/2015) 30 tablet 11   No facility-administered medications prior to visit.     Patient Active Problem List   Diagnosis Date Noted  . Sleep apnea 10/10/2015  . Gastroesophageal reflux disease without esophagitis 10/08/2015  . allergic rhinitis 10/03/2015  . Allergic conjunctivitis 10/03/2015  . Eczema 09/16/2015  . 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 Adjustment disorder with mixed anxiety and depressed mood 04/03/2015  . Obesity     Confidentiality was discussed with the patient and if applicable, with caregiver as well.  Patient's personal or confidential phone number: [phone]   The following portions of the patient's history were reviewed and updated as appropriate: allergies, current medications, past family history, past medical history, past social history, past surgical history and problem list.  Physical Exam:  Filed Vitals:   10/22/15 1357  Height: 5' 7.72" (1.72 m)  Weight: 307 lb 9.6 oz (139.526 kg)   Ht 5' 7.72" (1.72 m)  Wt 307 lb 9.6 oz (139.526 kg)  BMI 47.16 kg/m2  LMP 09/12/2015 Body mass index: body mass index is 47.16 kg/(m^2). Facility age limit for growth percentiles is 20 years.  Physical Exam  Constitutional: She is oriented to person, place, and time. She appears well-developed. No distress.  obese  HENT:  Head: Normocephalic and atraumatic.  Eyes: EOM are normal. Pupils are equal, round, and reactive to light. No scleral icterus.  Neck: Normal range of motion. Neck supple. No thyromegaly present.  Cardiovascular:  Normal rate, regular rhythm, normal heart sounds and intact distal pulses.   No murmur heard. Pulmonary/Chest: Effort normal and breath sounds normal.  Abdominal: Soft.  Musculoskeletal: Normal range of motion. She exhibits no edema.  Lymphadenopathy:    She has no cervical adenopathy.  Neurological: She is alert and oriented to person, place, and time. No cranial nerve deficit.  Skin: Skin is warm and  dry. No rash noted.  Psychiatric: She has a normal mood and affect. Her behavior is normal. Judgment and thought content normal.     Assessment/Plan: 1. Nexplanon in place Well-healed, no concerns Continue to watch for BTB; reviewed management of BTB options. She will monitor cycles.   2. Gastroesophageal reflux disease without esophagitis Will write for 90-day supply due to insurance issues Follow up with PCP for continued treatment - omeprazole (PRILOSEC) 20 MG capsule; Take 1 capsule (20 mg total) by mouth daily.  Dispense: 90 capsule; Refill: 0  Follow-up:  Return if symptoms worsen or fail to improve, for Nexplanon Follow-Up.   Medical decision-making:  > 25 minutes spent, more than 50% of appointment was spent discussing diagnosis and management of symptoms

## 2015-10-22 NOTE — Telephone Encounter (Signed)
[facility].  She reported that she does have insurance up to the end of this month and would like to get into see ENT as soon as possible.  She reported she originally did not want to have it scheduled due to her insurance situation, however, [facility] and it was confirmed she will have insurance at the end of the week.  This [facility] will follow up with Patient Care Coordinator about the referral.

## 2015-10-22 NOTE — Telephone Encounter (Signed)
Pt called and would like to get a call back from CalpineJasmine.

## 2015-10-30 ENCOUNTER — Ambulatory Visit (HOSPITAL_BASED_OUTPATIENT_CLINIC_OR_DEPARTMENT_OTHER): Payer: Medicaid Other | Attending: Otolaryngology | Admitting: Internal Medicine

## 2015-10-30 ENCOUNTER — Encounter: Payer: Self-pay | Admitting: Pediatrics

## 2015-10-30 ENCOUNTER — Ambulatory Visit (HOSPITAL_BASED_OUTPATIENT_CLINIC_OR_DEPARTMENT_OTHER): Payer: Medicaid Other

## 2015-10-30 DIAGNOSIS — G4733 Obstructive sleep apnea (adult) (pediatric): Secondary | ICD-10-CM | POA: Diagnosis not present

## 2015-10-30 DIAGNOSIS — G4730 Sleep apnea, unspecified: Secondary | ICD-10-CM | POA: Diagnosis present

## 2015-10-30 DIAGNOSIS — R0683 Snoring: Secondary | ICD-10-CM | POA: Insufficient documentation

## 2015-11-02 DIAGNOSIS — G4733 Obstructive sleep apnea (adult) (pediatric): Secondary | ICD-10-CM

## 2015-11-02 NOTE — Procedures (Signed)
  Patient Name: Carrie Allen, Aneya Study Date: 10/30/2015 Gender: Female D.O.B: [birth year] Age (years): 20 Referring Provider: Serena ColonelJefry Rosen Height (inches): 67 Interpreting Physician: Jetty Duhamellinton Kadeen Sroka MD, ABSM Weight (lbs): 308 RPSGT: Melburn PopperWillard, Susan BMI: 48 MRN: [id number] Neck Size: 17.00 CLINICAL INFORMATION The patient is referred for a CPAP titration to treat sleep apnea.   Date of NPSG, Split Night or HST:  Diagnostic NPSG 10/15/15  AHI 104/ hr, desaturation to 69%, body weight 308 lbs  SLEEP STUDY TECHNIQUE As per the AASM Manual for the Scoring of Sleep and Associated Events v2.3 (April 2016) with a hypopnea requiring 4% desaturations. The channels recorded and monitored were frontal, central and occipital EEG, electrooculogram (EOG), submentalis EMG (chin), nasal and oral airflow, thoracic and abdominal wall motion, anterior tibialis EMG, snore microphone, electrocardiogram, and pulse oximetry. Continuous positive airway pressure (CPAP) was initiated at the beginning of the study and titrated to treat sleep-disordered breathing.  MEDICATIONS Medications taken by the patient : charted for review Medications administered by patient during sleep study : No sleep medicine administered.  TECHNICIAN COMMENTS Comments added by technician: NONE  Comments added by scorer: N/A  RESPIRATORY PARAMETERS Optimal PAP Pressure (cm): 15 AHI at Optimal Pressure (/hr): 0.0 Overall Minimal O2 (%): 82.00 Supine % at Optimal Pressure (%): 3 Minimal O2 at Optimal Pressure (%): 89.0    SLEEP ARCHITECTURE The study was initiated at 10:50:56 PM and ended at 4:57:04 AM. Sleep onset time was 118.1 minutes and the sleep efficiency was 63.0%. The total sleep time was 230.5 minutes. The patient spent 2.39% of the night in stage N1 sleep, 41.00% in stage N2 sleep, 39.70% in stage N3 and 16.92% in REM.Stage REM latency was 92.5 minutes Wake after sleep onset was 17.5. Alpha intrusion was absent. Supine  sleep was 47.17%.  CARDIAC DATA The 2 lead EKG demonstrated sinus rhythm. The mean heart rate was 85.94 beats per minute. Other EKG findings include: None.  LEG MOVEMENT DATA The total Periodic Limb Movements of Sleep (PLMS) were 171. The PLMS index was 44.51. A PLMS index of <15 is considered normal in adults. Arousal Index was 1.6/ hr.  IMPRESSIONS - The optimal PAP pressure was 15 cm of water. - Central sleep apnea was not noted during this titration (CAI = 1.0/h). - Moderate oxygen desaturations were observed during this titration (min O2 = 82.00%). - The patient snored with Moderate snoring volume during this titration study. - No cardiac abnormalities were observed during this study. - Moderate periodic limb movements were observed during this study. Arousals associated with PLMs were rare.  DIAGNOSIS - Obstructive Sleep Apnea (327.23 [G47.33 ICD-10])  RECOMMENDATIONS - Trial of CPAP therapy on 15 cm H2O with a Medium size Fisher&Paykel Full Face Mask Simplus mask and heated humidification. - Avoid alcohol, sedatives and other CNS depressants that may worsen sleep apnea and disrupt normal sleep architecture. - Sleep hygiene should be reviewed to assess factors that may improve sleep quality. - Weight management and regular exercise should be initiated or continued.   Waymon BudgeYOUNG,Zailyn Rowser D Diplomate, American Board of Sleep Medicine  ELECTRONICALLY SIGNED ON:  11/02/2015, 10:31 AM [facility] PH: [phone]   FX: [phone] ACCREDITED BY THE AMERICAN ACADEMY OF SLEEP MEDICINE

## 2015-11-07 ENCOUNTER — Encounter: Payer: Self-pay | Admitting: Pediatrics

## 2016-01-15 ENCOUNTER — Ambulatory Visit: Payer: Self-pay | Attending: Internal Medicine

## 2016-02-10 ENCOUNTER — Encounter (HOSPITAL_COMMUNITY): Payer: Self-pay

## 2016-02-10 ENCOUNTER — Emergency Department (HOSPITAL_COMMUNITY)
Admission: EM | Admit: 2016-02-10 | Discharge: 2016-02-10 | Disposition: A | Discharge status: Home / Self Care | Payer: Medicaid Other | Attending: Emergency Medicine | Admitting: Emergency Medicine

## 2016-02-10 ENCOUNTER — Emergency Department (HOSPITAL_COMMUNITY): Payer: Medicaid Other

## 2016-02-10 DIAGNOSIS — W010XXA Fall on same level from slipping, tripping and stumbling without subsequent striking against object, initial encounter: Secondary | ICD-10-CM | POA: Insufficient documentation

## 2016-02-10 DIAGNOSIS — Z87891 Personal history of nicotine dependence: Secondary | ICD-10-CM | POA: Insufficient documentation

## 2016-02-10 DIAGNOSIS — S8001XA Contusion of right knee, initial encounter: Secondary | ICD-10-CM | POA: Insufficient documentation

## 2016-02-10 DIAGNOSIS — Y929 Unspecified place or not applicable: Secondary | ICD-10-CM | POA: Insufficient documentation

## 2016-02-10 DIAGNOSIS — Y939 Activity, unspecified: Secondary | ICD-10-CM | POA: Insufficient documentation

## 2016-02-10 DIAGNOSIS — Y999 Unspecified external cause status: Secondary | ICD-10-CM | POA: Insufficient documentation

## 2016-02-10 HISTORY — DX: Sleep apnea, unspecified: G47.30

## 2016-02-10 MED ORDER — NAPROXEN 500 MG PO TABS: 500.0000 mg | ORAL_TABLET | Freq: Two times a day (BID) | ORAL | 0 refills | Status: DC

## 2016-02-10 NOTE — ED Notes (Signed)
Case management states they will call patient with follow up

## 2016-02-10 NOTE — Discharge Planning (Signed)
EDCM was able to contact [facility] to verify pt enrolled in [employer] program without PC.  EDCM obtained follow up appoint with Georgian CoAngela McClung for 9/14 at 11:30.  Information relayed to pt.

## 2016-02-10 NOTE — ED Notes (Signed)
Ortho at bedside.

## 2016-02-10 NOTE — ED Provider Notes (Signed)
[facility]-EMERGENCY DEPT Provider Note   CSN: [id number] Arrival date & time: 02/10/16  1134     History   Chief Complaint Chief Complaint  Patient presents with  . Knee Injury    HPI Carrie Allen is a 20 y.o. female who presents to the ED with knee pain. She reports that she slipped on a fabric softener sheet yesterday and fell on her right knee.  HPI. She states that the pain radiates from the knee to the right hip. She has been ambulatory since the injury. She denies head injury or LOC. She is on the waiting list for PCP with [facility] and has an [facility] but they have no appointments.  Past Medical History:  Diagnosis Date  . Obesity   . Sleep apnea     Patient Active Problem List   Diagnosis Date Noted  . Sleep apnea 10/10/2015  . Gastroesophageal reflux disease without esophagitis 10/08/2015  . allergic rhinitis 10/03/2015  . Allergic conjunctivitis 10/03/2015  . Eczema 09/16/2015  . 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 Adjustment disorder with mixed anxiety and depressed mood 04/03/2015  . Obesity     History reviewed. No pertinent surgical history.  OB History    No data available       Home Medications    Prior to Admission medications   Medication Sig Start Date End Date Taking? Authorizing Provider  cetirizine (ZYRTEC) 10 MG tablet Take one tablet at bedtime every day for allergies 10/03/15   Gregor HamsJacqueline Tebben, NP  fluticasone [facility](FLONASE) 50 MCG/ACT nasal spray 2 sprays each nostril every morning for allergies with congestion 10/03/15   Gregor HamsJacqueline Tebben, NP  naproxen (NAPROSYN) 500 MG tablet Take 1 tablet (500 mg total) by mouth 2 (two) times daily. 02/10/16   Fletcher Rathbun Orlene OchM Joscelynn Brutus, NP  Olopatadine HCl (PATADAY) 0.2 % SOLN One drop in each eye once a day for allergies 10/03/15   Gregor HamsJacqueline Tebben, NP  omeprazole (PRILOSEC) 20 MG capsule Take 1 capsule (20 mg total) by mouth daily. 10/22/15   Christianne Dolinhristy Millican, NP  Prenatal Vit-Fe Fumarate-FA (PRENATAL VITAMINS  PLUS) 27-1 MG TABS Take 1 tablet by mouth daily. Patient not taking: Reported on 10/01/2015 04/02/15   Christianne Dolinhristy Millican, NP  triamcinolone ointment (KENALOG) 0.5 % Apply 1 application topically 2 (two) times daily. For moderate to severe eczema.  Do not use for more than 1 week at a time. 09/16/15   Kalman JewelsShannon McQueen, MD    Family History Family History  Problem Relation Age of Onset  . Autism Brother     Social History Social History  Substance Use Topics  . Smoking status: Former Games developermoker  . Smokeless tobacco: Never Used     Comment: she smokes irreg and GF smokes outdoors  . Alcohol use 0.0 oz/week     Comment: occ     Allergies   Review of patient's allergies indicates no known allergies.   Review of Systems Review of Systems  Musculoskeletal: Positive for arthralgias.  Skin: Negative for wound.  Neurological: Negative for headaches.     Physical Exam Updated Vital Signs BP 140/90   Pulse 75   Temp 98.6 F (37 C) (Oral)   Resp 18   Ht 5\' 7"  (1.702 m)   Wt 131.5 kg   SpO2 100%   BMI 45.42 kg/m   Physical Exam  Constitutional:  Morbidly obese   HENT:  Head: Normocephalic and atraumatic.  Eyes: EOM are normal.  Neck: Normal range of motion. Neck  supple.  Cardiovascular: Normal rate.   Pulmonary/Chest: Effort normal.  Musculoskeletal:       Right knee: She exhibits swelling and ecchymosis. She exhibits normal range of motion, no laceration, no erythema and normal alignment. Tenderness found.       Legs: Full passive range of motion with minimal pain. Patient complains of pain with flexion of the knee. Pedal pulses 2+, adequate circulation. Equal strength lower extremities.   Psychiatric: She has a normal mood and affect. Her behavior is normal.     ED Treatments / Results  Labs (all labs ordered are listed, but only abnormal results are displayed) Labs Reviewed - No data to display   Radiology Dg Knee Complete 4 Views Right  Result Date:  02/10/2016 CLINICAL DATA:  Fall yesterday with right knee pain. EXAM: RIGHT KNEE - COMPLETE 4+ VIEW COMPARISON:  None. FINDINGS: No evidence of fracture, dislocation, or joint effusion. No evidence of arthropathy or other focal bone abnormality. Soft tissues are unremarkable. IMPRESSION: Negative. Electronically Signed   By: Elberta Fortis M.D.   On: 02/10/2016 12:19    Procedures Procedures (including critical care time)  Medications Ordered in ED Medications - No data to display   Initial Impression / Assessment and Plan / ED Course  I have reviewed the triage vital signs and the nursing notes.  Pertinent imaging results that were available during my care of the patient were reviewed by me and considered in my medical decision making (see chart for details).  Clinical Course   Knee immobilizer applied but patient states it is not comfortable so it was removed and ace wrap applied,ice and elevation. She will f/u with ortho if symptoms persist.   Final Clinical Impressions(s) / ED Diagnoses  20 y.o. with right knee pain stable for d/c without fracture or dislocation.  Final diagnoses:  Knee contusion, right, initial encounter    New Prescriptions Discharge Medication List as of 02/10/2016  1:35 PM    START taking these medications   Details  naproxen (NAPROSYN) 500 MG tablet Take 1 tablet (500 mg total) by mouth 2 (two) times daily., Starting Mon 02/10/2016, Print         [place], NP 02/11/16 2244    Donnetta Hutching, MD 02/12/16 [phone]

## 2016-02-10 NOTE — ED Triage Notes (Signed)
Per pT, Pt is coming from home where she slipped on a fabric softener sheet yesterday and fell to her knee. Pt reports pain in her right knee that is radiating to the right hip.

## 2016-02-10 NOTE — Discharge Planning (Signed)
[facility].  Pt noted to have Medicaid and [facility]  EDCM researched chart to find that pt [place] [facility] in which a PCP is usually attached.  Call in to [facility] (where she was enrolled) to inquire about PCP for pt.

## 2016-02-10 NOTE — ED Notes (Signed)
Pt denied concerns with dc

## 2016-02-10 NOTE — Progress Notes (Signed)
Orthopedic Tech Progress Note Patient Details:  Carrie Allen 05/18/1996 [id number]  Ortho Devices Type of Ortho Device: Ace wrap, Crutches Ortho Device/Splint Location: rle Ortho Device/Splint Interventions: Application   Carrie Allen 02/10/2016, 2:24 PM Viewed order from doctor's order list

## 2016-02-10 NOTE — ED Notes (Signed)
Pt not tolerating knee immobilizer, ace wrap ordered and placed instead, ortho at bedside

## 2016-02-13 ENCOUNTER — Ambulatory Visit: Payer: Self-pay | Attending: Internal Medicine | Admitting: Internal Medicine

## 2016-02-13 ENCOUNTER — Encounter: Payer: Self-pay | Admitting: Internal Medicine

## 2016-02-13 VITALS — BP 118/73 | HR 101 | Temp 98.5°F | Ht 67.0 in | Wt 322.0 lb

## 2016-02-13 DIAGNOSIS — M25569 Pain in unspecified knee: Secondary | ICD-10-CM

## 2016-02-13 DIAGNOSIS — G4730 Sleep apnea, unspecified: Secondary | ICD-10-CM | POA: Insufficient documentation

## 2016-02-13 DIAGNOSIS — M25562 Pain in left knee: Secondary | ICD-10-CM

## 2016-02-13 DIAGNOSIS — K029 Dental caries, unspecified: Secondary | ICD-10-CM | POA: Insufficient documentation

## 2016-02-13 DIAGNOSIS — F17210 Nicotine dependence, cigarettes, uncomplicated: Secondary | ICD-10-CM | POA: Insufficient documentation

## 2016-02-13 DIAGNOSIS — X58XXXA Exposure to other specified factors, initial encounter: Secondary | ICD-10-CM | POA: Insufficient documentation

## 2016-02-13 DIAGNOSIS — S8001XA Contusion of right knee, initial encounter: Secondary | ICD-10-CM | POA: Insufficient documentation

## 2016-02-13 DIAGNOSIS — J302 Other seasonal allergic rhinitis: Secondary | ICD-10-CM | POA: Insufficient documentation

## 2016-02-13 DIAGNOSIS — Z6841 Body Mass Index (BMI) 40.0 and over, adult: Secondary | ICD-10-CM | POA: Insufficient documentation

## 2016-02-13 HISTORY — DX: Pain in unspecified knee: M25.569

## 2016-02-13 MED ORDER — DIPHENHYDRAMINE HCL 25 MG PO TABS: 25.0000 mg | ORAL_TABLET | Freq: Four times a day (QID) | ORAL | 0 refills | Status: DC | PRN

## 2016-02-13 NOTE — Patient Instructions (Signed)
Allergic Rhinitis Allergic rhinitis is when the mucous membranes in the nose respond to allergens. Allergens are particles in the air that cause your body to have an allergic reaction. This causes you to release allergic antibodies. Through a chain of events, these eventually cause you to release histamine into the blood stream. Although meant to protect the body, it is this release of histamine that causes your discomfort, such as frequent sneezing, congestion, and an itchy, runny nose.  CAUSES Seasonal allergic rhinitis (hay fever) is caused by pollen allergens that may come from grasses, trees, and weeds. Year-round allergic rhinitis (perennial allergic rhinitis) is caused by allergens such as house dust mites, pet dander, and mold spores. SYMPTOMS  Nasal stuffiness (congestion).  Itchy, runny nose with sneezing and tearing of the eyes. DIAGNOSIS Your health care provider can help you determine the allergen or allergens that trigger your symptoms. If you and your health care provider are unable to determine the allergen, skin or blood testing may be used. Your health care provider will diagnose your condition after taking your health history and performing a physical exam. Your health care provider may assess you for other related conditions, such as asthma, pink eye, or an ear infection. TREATMENT Allergic rhinitis does not have a cure, but it can be controlled by:  Medicines that block allergy symptoms. These may include allergy shots, nasal sprays, and oral antihistamines.  Avoiding the allergen. Hay fever may often be treated with antihistamines in pill or nasal spray forms. Antihistamines block the effects of histamine. There are over-the-counter medicines that may help with nasal congestion and swelling around the eyes. Check with your health care provider before taking or giving this medicine. If avoiding the allergen or the medicine prescribed do not work, there are many new medicines  your health care provider can prescribe. Stronger medicine may be used if initial measures are ineffective. Desensitizing injections can be used if medicine and avoidance does not work. Desensitization is when a patient is given ongoing shots until the body becomes less sensitive to the allergen. Make sure you follow up with your health care provider if problems continue. HOME CARE INSTRUCTIONS It is not possible to completely avoid allergens, but you can reduce your symptoms by taking steps to limit your exposure to them. It helps to know exactly what you are allergic to so that you can avoid your specific triggers. SEEK MEDICAL CARE IF:  You have a fever.  You develop a cough that does not stop easily (persistent).  You have shortness of breath.  You start wheezing.  Symptoms interfere with normal daily activities.   This information is not intended to replace advice given to you by your health care provider. Make sure you discuss any questions you have with your health care provider.   Document Released: 02/10/2001 Document Revised: 06/08/2014 Document Reviewed: 01/23/2013 Elsevier Interactive Patient Education 2016 Elsevier Inc.  

## 2016-02-13 NOTE — Progress Notes (Signed)
Carrie Allen, is a 20 y.o. female  ZOX:[id number]  [id number]  DOB - 1996-02-27  CC:  Chief Complaint  Patient presents with  . Hospitalization Follow-up    right knee contusion      HPI: Carrie Allen is a 20 y.o. female here today to establish medical care and hospital ED visit follow up. Only medical history is obesity and sleep apnea on home CPAP machine which she uses inconsistently. She reportedly  slipped on a fabric softener sheet at home and fell on her right knee then went to the ED because of excruciating pain. She states that the pain radiates from the knee to the right hip. She has been ambulatory since the injury. She denies head injury or LOC. X-Ray was negative of any fracture or dislocation. She was discharged on naproxen to be followed up today. She has no swelling, pain is much improved without .medication. Her main concern today is her rhinitis from allergy. She takes some cetrizine but has not been effective lately. She denies any SOB, only occasional cough and sneezing. No dizziness. No rash. Patient has No headache, No chest pain, No abdominal pain - No Nausea, No new weakness tingling or numbness. She claims she quarrels a lot with her grand mother who she lives with, her mother lives somewhere different and her father is not involved in her life. She gets frustrated because her grand mother makes her do house chores including cleaning after her dog which she does not want to do most of the time. She has a younger brother who is autistic, but in her opinion, her younger brother should also do some cleaning instead of playing games in one spot. She denies being depressed, only angry most times. She denies any suicidal ideation or thoughts at the moment, and none in the last month at least.  No Known Allergies Past Medical History:  Diagnosis Date  . Obesity   . Sleep apnea    Current Outpatient Prescriptions on File Prior to Visit  Medication Sig Dispense Refill   . fluticasone (FLONASE) 50 MCG/ACT nasal spray 2 sprays each nostril every morning for allergies with congestion 16 g 12  . Olopatadine HCl (PATADAY) 0.2 % SOLN One drop in each eye once a day for allergies 1 Bottle 5  . triamcinolone ointment (KENALOG) 0.5 % Apply 1 application topically 2 (two) times Allen. For moderate to severe eczema.  Do not use for more than 1 week at a time. 60 g 3  . cetirizine (ZYRTEC) 10 MG tablet Take one tablet at bedtime every day for allergies (Patient not taking: Reported on 02/13/2016) 30 tablet 11  . naproxen (NAPROSYN) 500 MG tablet Take 1 tablet (500 mg total) by mouth 2 (two) times Allen. (Patient not taking: Reported on 02/13/2016) 20 tablet 0  . omeprazole (PRILOSEC) 20 MG capsule Take 1 capsule (20 mg total) by mouth Allen. (Patient not taking: Reported on 02/13/2016) 90 capsule 0   No current facility-administered medications on file prior to visit.    Family History  Problem Relation Age of Onset  . Autism Brother    Social History   Social History  . Marital status: Single    Spouse name: N/A  . Number of children: N/A  . Years of education: N/A   Occupational History  . Not on file.   Social History Main Topics  . Smoking status: Current Some Day Smoker    Packs/day: 0.25  . Smokeless tobacco: Never Used  Comment: she smokes irreg and GF smokes outdoors  . Alcohol use 0.6 oz/week    1 Shots of liquor per week     Comment: socially  . Drug use:     Types: Marijuana, Cocaine     Comment: last year- cocaine  marijuana- a couple of days ago  . Sexual activity: Yes    Birth control/ protection: Implant   Other Topics Concern  . Not on file   Social History Narrative  . No narrative on file    Review of Systems: Constitutional: Negative for fever, chills, diaphoresis, activity change, appetite change and fatigue. HENT: Negative for ear pain, nosebleeds, ++congestion, no facial swelling, neck pain, neck stiffness or ear discharge.   Eyes: Negative for pain, discharge, redness, itching and visual disturbance. Respiratory: Positive for cough, no choking, chest tightness, shortness of breath, wheezing or stridor.  Cardiovascular: Negative for chest pain, palpitations and leg swelling. Gastrointestinal: Negative for abdominal distention. Genitourinary: Negative for dysuria, urgency, frequency, hematuria, flank pain, decreased urine volume, difficulty urinating and dyspareunia.  Neurological: Negative for dizziness, tremors, seizures, syncope, facial asymmetry, speech difficulty, weakness, light-headedness, numbness and headaches.  Hematological: Negative for adenopathy. Does not bruise/bleed easily. Psychiatric/Behavioral: Negative for hallucinations, behavioral problems, confusion, dysphoric mood, decreased concentration and agitation.    Objective:   Vitals:   02/13/16 1124  BP: 118/73  Pulse: (!) 101  Temp: 98.5 F (36.9 C)    Physical Exam: Constitutional: Patient appears well-developed and well-nourished. No distress. Morbidly obese HENT: Poor dentition. Normocephalic, atraumatic, External right and left ear normal. Oropharynx is clear and moist.  Eyes: Conjunctivae and EOM are normal. PERRLA, no scleral icterus. Neck: Normal ROM. Neck supple. No JVD. No tracheal deviation. No thyromegaly. CVS: RRR, S1/S2 +, no murmurs, no gallops, no carotid bruit.  Pulmonary: Effort and breath sounds normal, no stridor, rhonchi, wheezes, rales.  Abdominal: Soft. BS +, no distension, tenderness, rebound or guarding.  Musculoskeletal: Normal range of motion. No edema and no tenderness.  Lymphadenopathy: No lymphadenopathy noted, cervical, inguinal or axillary Neuro: Alert. Normal reflexes, muscle tone coordination. No cranial nerve deficit. Skin: Skin is warm and dry. No rash noted. Not diaphoretic. No erythema. No pallor. Psychiatric: Normal mood and affect. Behavior, judgment, thought content normal.  Lab Results    Component Value Date   WBC 13.7 (H) 10/27/2014   HGB 9.5 (L) 10/27/2014   HCT 31.8 (L) 10/27/2014   MCV 70.4 (L) 10/27/2014   PLT 258 10/27/2014   Lab Results  Component Value Date   CREATININE 0.72 10/27/2014   BUN 9 10/27/2014   NA 138 10/27/2014   K 3.6 10/27/2014   CL 102 10/27/2014   CO2 25 10/27/2014    No results found for: HGBA1C Lipid Panel  No results found for: CHOL, TRIG, HDL, CHOLHDL, VLDL, LDLCALC     Assessment and plan:   1. Knee pain, acute, left  - Continue Naproxen  - Warm compress  2. Other seasonal allergic rhinitis  - diphenhydrAMINE (BENADRYL) 25 MG tablet; Take 1 tablet (25 mg total) by mouth every 6 (six) hours as needed.  Dispense: 30 tablet; Refill: 0  3. Morbid obesity due to excess calories (HCC)  - Amb ref to Medical Nutrition Therapy-MNT  4. Dental caries  - Ambulatory referral to Dentistry  Return in about 1 year (around 02/12/2017) for Annual Physical.  The patient was given clear instructions to go to ER or return to medical center if symptoms don't improve, worsen or new  problems develop. The patient verbalized understanding. The patient was told to call to get lab results if they haven't heard anything in the next week.     This note has been created with Education officer, environmental. Any transcriptional errors are unintentional.    Jeanann Lewandowsky, MD, MHA, Maxwell Caul, CPE [facility] [place], [place] [phone]   02/13/2016, 1:02 PM

## 2016-02-14 ENCOUNTER — Telehealth: Payer: Self-pay | Admitting: Pediatrics

## 2016-02-14 NOTE — Telephone Encounter (Signed)
Patients grandmother called and would like to know what patient can take to help reduce pain she experiences when she coughs. Please follow up.

## 2016-02-19 ENCOUNTER — Encounter: Payer: Self-pay | Admitting: Skilled Nursing Facility1

## 2016-02-19 ENCOUNTER — Encounter: Payer: Medicaid Other | Attending: Internal Medicine | Admitting: Skilled Nursing Facility1

## 2016-02-19 DIAGNOSIS — Z713 Dietary counseling and surveillance: Secondary | ICD-10-CM | POA: Insufficient documentation

## 2016-02-19 NOTE — Patient Instructions (Addendum)
-  Wear your C-PAP every night -Eat 3 meals a day and snacks in between (if you are hungry for the snacks) -A meal: carbohydrate, protein, vegetable -A snack: A Fruit OR Vegetable AND Protein -Honor your body by listening to your hunger and fullness cues -First thought: Am I hungry? -Second thought: (if the answer is yes I am hungry) Does this meal have vegetables? Protein? Carbohydrate?  -Third thought: Are there more vegetables on my plate compared to Protein and Carbohydrates? -After you have finished your first serving Do Not go back for more until you have waited 20-30 minutes and checked in with your body by asking Am I Still Hungry?    -Eating frequently throughout the day keeps your metabolism at a using rate     -If you are hungry your body is telling you it is time to refuel so listen to it and eat  -3 days a week walk, youtube, or a third thing you come up with for 45 minutes or a 30 minute workout   -Look for a hobby to get into

## 2016-02-19 NOTE — Progress Notes (Signed)
  Medical Nutrition Therapy:  Appt start time: 11:15 end time:  12:15   Assessment:  Primary concerns today: referred for obesity. Pt states last year she weighed 270-290 pounds and states she is not used to being over 300 pounds. Pt states she is depressed because of her wt. Pt states she has been big since 9th grade. Pt states she has always been picked on in school due to her wt. Pt states her family constantly judges her for her wt saying things like you cant where that because your fat hangs out (mostly her grandparents who she lives with as well as her brother who has autism).  Pt states she has not been wearing her C-PAP. Pt states her bowel movements are normal. Pt states she is looking for work. Pt states she often eats to the point her stomach hurts. Pt did not seem to listen to what the dietitian said even though she was able to repeat what was said-pt seemed to comprehend the information but also rebelled against it.  Preferred Learning Style:   Auditory  Learning Readiness:  Contemplating  MEDICATIONS: See List   DIETARY INTAKE:  Usual eating pattern includes 3 meals and 1 snacks per day.  Everyday foods include none stated.  Avoided foods include none stated.    24-hr recall:  B ( AM): none----omelet---breakfast wrap Snk ( AM): [place]key meat L ( PM):sandwich with turkey-----soup Snk ( PM): none D ( PM): chicken pie----roast beef---chicken and rice casserole Snk ( PM): poptart----popcorm Beverages: soda, water, juice Usual physical activity: ADL's *meals outside the home: 7+ Estimated energy needs: 1600 calories 180 g carbohydrates 120 g protein 44 g fat  Progress Towards Goal(s):  In progress.   Nutritional Diagnosis:  [place]-3.3 Overweight/obesity As related to overconsumption.  As evidenced by pt report, 24 hr recall,and BMI 50.43.    Intervention:  Nutrition counseling for obesity. Dietitian educated the pt on meal bance, frequency, hunger and fullness cues,a nd  the importance of physical activity. Goals: -Wear your C-PAP every night -Eat 3 meals a day and snacks in between (if you are hungry for the snacks) -A meal: carbohydrate, protein, vegetable -A snack: A Fruit OR Vegetable AND Protein -Honor your body by listening to your hunger and fullness cues -First thought: Am I hungry? -Second thought: (if the answer is yes I am hungry) Does this meal have vegetables? Protein? Carbohydrate?  -Third thought: Are there more vegetables on my plate compared to Protein and Carbohydrates? -After you have finished your first serving Do Not go back for more until you have waited 20-30 minutes and checked in with your body by asking Am I Still Hungry?    -Eating frequently throughout the day keeps your metabolism at a using rate     -If you are hungry your body is telling you it is time to refuel so listen to it and eat  -3 days a week walk, youtube, or a third thing you come up with for 45 minutes or a 30 minute workout   -Look for a hobby to get into  Teaching Method Utilized:  Visual Auditory Hands on  Handouts given during visit include: MyPlate  Barriers to learning/adherence to lifestyle change: unsupportive family/not ready  Demonstrated degree of understanding via:  Teach Back   Monitoring/Evaluation:  Dietary intake, exercise, and body weight prn.

## 2016-10-19 ENCOUNTER — Ambulatory Visit: Payer: Medicaid Other

## 2016-10-29 ENCOUNTER — Ambulatory Visit: Payer: Self-pay | Attending: Internal Medicine

## 2016-11-18 ENCOUNTER — Encounter: Payer: Self-pay | Admitting: Internal Medicine

## 2016-11-18 ENCOUNTER — Ambulatory Visit: Payer: Self-pay | Attending: Internal Medicine | Admitting: Licensed Clinical Social Worker

## 2016-11-18 ENCOUNTER — Other Ambulatory Visit: Payer: Self-pay

## 2016-11-18 ENCOUNTER — Ambulatory Visit: Payer: Self-pay | Attending: Internal Medicine | Admitting: Internal Medicine

## 2016-11-18 ENCOUNTER — Other Ambulatory Visit: Payer: Self-pay | Admitting: Pharmacist

## 2016-11-18 ENCOUNTER — Ambulatory Visit (HOSPITAL_COMMUNITY)
Admission: RE | Admit: 2016-11-18 | Discharge: 2016-11-18 | Disposition: A | Discharge status: Home / Self Care | Payer: Self-pay | Source: Ambulatory Visit | Attending: Internal Medicine | Admitting: Internal Medicine

## 2016-11-18 DIAGNOSIS — F329 Major depressive disorder, single episode, unspecified: Secondary | ICD-10-CM

## 2016-11-18 DIAGNOSIS — F32A Depression, unspecified: Secondary | ICD-10-CM | POA: Insufficient documentation

## 2016-11-18 DIAGNOSIS — F419 Anxiety disorder, unspecified: Secondary | ICD-10-CM | POA: Insufficient documentation

## 2016-11-18 DIAGNOSIS — Z79899 Other long term (current) drug therapy: Secondary | ICD-10-CM | POA: Insufficient documentation

## 2016-11-18 DIAGNOSIS — G4733 Obstructive sleep apnea (adult) (pediatric): Secondary | ICD-10-CM | POA: Insufficient documentation

## 2016-11-18 DIAGNOSIS — G4730 Sleep apnea, unspecified: Secondary | ICD-10-CM | POA: Insufficient documentation

## 2016-11-18 DIAGNOSIS — L209 Atopic dermatitis, unspecified: Secondary | ICD-10-CM | POA: Insufficient documentation

## 2016-11-18 DIAGNOSIS — L2084 Intrinsic (allergic) eczema: Secondary | ICD-10-CM

## 2016-11-18 LAB — POCT GLYCOSYLATED HEMOGLOBIN (HGB A1C): HEMOGLOBIN A1C: 5.9

## 2016-11-18 MED ORDER — BETAMETHASONE DIPROPIONATE 0.05 % EX CREA: TOPICAL_CREAM | Freq: Two times a day (BID) | CUTANEOUS | 3 refills | Status: DC

## 2016-11-18 MED ORDER — DULOXETINE HCL 30 MG PO CPEP
30.0000 mg | ORAL_CAPSULE | Freq: Every day | ORAL | 3 refills | Status: DC
Start: 2016-11-18 — End: 2017-02-24

## 2016-11-18 MED ORDER — HYDROXYZINE HCL 25 MG PO TABS: 25.0000 mg | ORAL_TABLET | Freq: Three times a day (TID) | ORAL | 3 refills | Status: DC | PRN

## 2016-11-18 MED ORDER — DULOXETINE HCL 30 MG PO CPEP: 30.0000 mg | ORAL_CAPSULE | Freq: Every day | ORAL | 3 refills | Status: DC

## 2016-11-18 MED FILL — ?DULOXETINE HCL DR 30 MG CA: 30 MG | 30 days supply | Qty: 30 | Fill #0

## 2016-11-18 MED FILL — BETAMETHASONE DP 0.05% CRM: 0.05 | 30 days supply | Qty: 45 | Fill #0

## 2016-11-18 MED FILL — ?HYDROXYZINE HCL 25 MG TAB: 25 MG | 20 days supply | Qty: 60 | Fill #0

## 2016-11-18 NOTE — BH Specialist Note (Signed)
Integrated Behavioral Health Initial Visit  MRN: [id number] Name: Carrie Allen   Session Start time: 10:30 AM Session End time: 11:00 AM Total time: 30 minutes  Type of Service: Integrated Behavioral Health- Individual/Family Interpretor:No. Interpretor Name and Language: N/A   Warm Hand Off Completed.       SUBJECTIVE: Carrie Allen is a 21 y.o. female accompanied by grandmother. Patient was referred by Dr. Hyman HopesJegede for depression and anxiety. Patient reports the following symptoms/concerns: overwhelming feelings of sadness and worry, difficulty sleeping, low energy, difficulty focusing, irritability, and suicidal ideations Duration of problem: Ongoing; Severity of problem: severe  OBJECTIVE: Mood: Anxious and Dysphoric and Affect: Appropriate Risk of harm to self or others: Suicidal ideation No plan to harm self or others   LIFE CONTEXT: Family and Social: Pt resides with grandmother and grandmother's spouse. She receives limited support from mother and sister School/Work: Pt is unemployed. She has an [employer] Self-Care: Pt enjoys listening to music and caring for her pet dog. Pt uses substances (alcohol) Life Changes: Pt recently moved back into grandmother's residence after being placed in "a bad situation" while residing with mother. Pt is experiencing financial strain and has limited support.  GOALS ADDRESSED: Patient will reduce symptoms of: anxiety and depression and increase knowledge and/or ability of: coping skills and also: Increase adequate support systems for patient/family and Decrease self-medicating behaviors   INTERVENTIONS: Solution-Focused Strategies, Supportive Counseling, Psychoeducation and/or Health Education and Link to [facility]  Standardized Assessments completed: PHQ 2&9 with C-SSRS  ASSESSMENT: Patient currently experiencing depression and anxiety triggered by substance use and financial strain. Patient reports overwhelming  feelings of sadness and worry, difficulty sleeping, low energy, difficulty focusing, irritability, and suicidal ideations. She has limited support and reports difficulty maintaining employment due to anxiety. Patient may benefit from psychoeducation, psychotherapy, and medication management. LCSWA educated pt on the cycle of depression and anxiety and discussed how substance use can negatively impact pt's mental and physical health. Pt reported hx of suicidal ideations; however, denied intent or plan to harm self or others. LCSWA discussed benefits of applying healthy coping skills to manage mental health. Pt was successful in identifying a coping skill to utilize on a routine basis. LCSWA provided pt with crisis intervention, psychotherapy, emotional well-being apps, and employment resources.  PLAN: 1. Follow up with behavioral health clinician on : Pt was encouraged to contact LCSWA if symptoms worsen or fail to improve to [facility]. 2. Behavioral recommendations: LCSWA recommends that pt apply healthy coping skills discussed, comply with medication management, and utilize provided resources. Pt is encouraged to schedule follow up appointment with LCSWA 3. Referral(s): ParamedicCommunity Mental Health Services (LME/Outside Clinic) and Community Resources:  Finances 4. "From scale of 1-10, how likely are you to follow plan?": 7/10  Bridgett LarssonJasmine D Normon Pettijohn, LCSW 11/19/16 4:18 PM

## 2016-11-18 NOTE — Progress Notes (Signed)
Carrie Allen, is a 21 y.o. female  JHE:[id number]  JEH:[id number]  DOB - 08/07/1995  Chief Complaint  Patient presents with  . Establish Care      Subjective:   Carrie Allen is a 21 y.o. female with history of morbid obesity and sleep apnea on CPAP machine but inconsistently used, presents today for a follow up visit after being lost to follow up for almost a year. Patient currently experiencing depression and anxiety triggered by substance use and financial strain. She reports overwhelming feelings of sadness and worry, difficulty sleeping, low energy, difficulty focusing, irritability, and suicidal ideations. She has limited support and reports difficulty maintaining employment due to anxiety. She denies current suicidal thoughts but really just want to be alright financially.   Problem  Anxiety and Depression  Intrinsic Atopic Dermatitis  Osa (Obstructive Sleep Apnea)  Morbid Obesity Due to Excess Calories (Hcc)    ALLERGIES: No Known Allergies  PAST MEDICAL HISTORY: Past Medical History:  Diagnosis Date  . Obesity   . Sleep apnea     MEDICATIONS AT HOME: Prior to Admission medications   Medication Sig Start Date End Date Taking? Authorizing Provider  cetirizine (ZYRTEC) 10 MG tablet Take one tablet at bedtime every day for allergies 10/03/15  Yes Ander Slade, NP  diphenhydrAMINE (BENADRYL) 25 MG tablet Take 1 tablet (25 mg total) by mouth every 6 (six) hours as needed. 02/13/16  Yes Tresa Garter, MD  fluticasone (FLONASE) 50 MCG/ACT nasal spray 2 sprays each nostril every morning for allergies with congestion 10/03/15  Yes Ander Slade, NP  Olopatadine HCl (PATADAY) 0.2 % SOLN One drop in each eye once a day for allergies 10/03/15  Yes Ander Slade, NP  omeprazole (PRILOSEC) 20 MG capsule Take 1 capsule (20 mg total) by mouth daily. 3/78/58  Yes Millican, Alyse Low, NP  betamethasone dipropionate (DIPROLENE) 0.05 % cream Apply topically 2 (two)  times daily. 11/18/16   Tresa Garter, MD  DULoxetine (CYMBALTA) 30 MG capsule Take 1 capsule (30 mg total) by mouth daily. 11/18/16   Tresa Garter, MD  hydrOXYzine (ATARAX/VISTARIL) 25 MG tablet Take 1 tablet (25 mg total) by mouth 3 (three) times daily as needed. 11/18/16   Tresa Garter, MD  naproxen (NAPROSYN) 500 MG tablet Take 1 tablet (500 mg total) by mouth 2 (two) times daily. Patient not taking: Reported on 02/13/2016 02/10/16   Ashley Murrain, NP  triamcinolone ointment (KENALOG) 0.5 % Apply 1 application topically 2 (two) times daily. For moderate to severe eczema.  Do not use for more than 1 week at a time. Patient not taking: Reported on 11/18/2016 09/16/15   Rae Lips, MD    Objective:   Vitals:   11/18/16 1044  BP: 118/82  Pulse: 61  Resp: 18  Temp: 98.6 F (37 C)  TempSrc: Oral  SpO2: 100%  Weight: (!) 306 lb (138.8 kg)  Height: '5\' 7"'$  (1.702 m)   Exam General appearance : Awake, alert, not in any distress. Speech Clear. Not toxic looking, morbidly obese HEENT: Atraumatic and Normocephalic, pupils equally reactive to light and accomodation Neck: Supple, no JVD. No cervical lymphadenopathy.  Chest: Good air entry bilaterally, no added sounds  CVS: S1 S2 regular, no murmurs.  Abdomen: Bowel sounds present, Non tender and not distended with no gaurding, rigidity or rebound. Extremities: B/L Lower Ext shows no edema, both legs are warm to touch Neurology: Awake alert, and oriented X 3, CN II-XII intact, Non focal Skin: No  Rash  Data Review No results found for: HGBA1C  Assessment & Plan   1. Morbid obesity due to excess calories [facility]): patient referred to and she attended nutritional education classes last year  - CBC with Differential/Platelet - CMP14+EGFR - Lipid panel - TSH - POCT glycosylated hemoglobin (Hb A1C)  Aim for 30 minutes of exercise most days. Rethink what you drink. Water is great! Aim for 2-3 Carb Choices per meal  (30-45 grams) +/- 1 either way  Aim for 0-15 Carbs per snack if hungry  Include protein in moderation with your meals and snacks  Consider reading food labels for Total Carbohydrate and Fat Grams of foods  Be mindful about how much sugar you are adding to beverages and other foods. Fruit Punch - find one with no sugar  Measure and decrease portions of carbohydrate foods  Make your plate and don't go back for seconds  2. Anxiety and depression  Prescribed: - hydrOXYzine (ATARAX/VISTARIL) 25 MG tablet; Take 1 tablet (25 mg total) by mouth 3 (three) times daily as needed.  Dispense: 60 tablet; Refill: 3 - DULoxetine (CYMBALTA) 30 MG capsule; Take 1 capsule (30 mg total) by mouth daily.  Dispense: 30 capsule; Refill: 3  - Patient referred to and was seen by our LCSW today for counseling, acute crisis and resource management.   - Ambulatory referral to Psychiatry  3. OSA (obstructive sleep apnea)  - Patient has CPAP machine but does not use it. Patient counseled extensively on use of CPAP and educated on its benefits  4. Intrinsic atopic dermatitis  - betamethasone dipropionate (DIPROLENE) 0.05 % cream; Apply topically 2 (two) times daily.  Dispense: 45 g; Refill: 3 - hydrOXYzine (ATARAX/VISTARIL) 25 MG tablet; Take 1 tablet (25 mg total) by mouth 3 (three) times daily as needed.  Dispense: 60 tablet; Refill: 3  Patient have been counseled extensively about nutrition and exercise. Other issues discussed during this visit include: low cholesterol diet, weight control and daily exercise  Return in about 3 months (around 02/18/2017) for Depression and Anxiety.  The patient was given clear instructions to go to ER or return to medical center if symptoms don't improve, worsen or new problems develop. The patient verbalized understanding. The patient was told to call to get lab results if they haven't heard anything in the next week.   This note has been created with Administrator. Any transcriptional errors are unintentional.    Angelica Chessman, MD, [place], Karilyn Cota, [place] [facility], [place]   11/18/2016, 11:34 AM

## 2016-11-18 NOTE — Patient Instructions (Signed)
Generalized Anxiety Disorder, Adult Generalized anxiety disorder (GAD) is a mental health disorder. People with this condition constantly worry about everyday events. Unlike normal anxiety, worry related to GAD is not triggered by a specific event. These worries also do not fade or get better with time. GAD interferes with life functions, including relationships, work, and school. GAD can vary from mild to severe. People with severe GAD can have intense waves of anxiety with physical symptoms (panic attacks). What are the causes? The exact cause of GAD is not known. What increases the risk? This condition is more likely to develop in:  Women.  People who have a family history of anxiety disorders.  People who are very shy.  People who experience very stressful life events, such as the death of a loved one.  People who have a very stressful family environment.  What are the signs or symptoms? People with GAD often worry excessively about many things in their lives, such as their health and family. They may also be overly concerned about:  Doing well at work.  Being on time.  Natural disasters.  Friendships.  Physical symptoms of GAD include:  Fatigue.  Muscle tension or having muscle twitches.  Trembling or feeling shaky.  Being easily startled.  Feeling like your heart is pounding or racing.  Feeling out of breath or like you cannot take a deep breath.  Having trouble falling asleep or staying asleep.  Sweating.  Nausea, diarrhea, or irritable bowel syndrome (IBS).  Headaches.  Trouble concentrating or remembering facts.  Restlessness.  Irritability.  How is this diagnosed? Your health care provider can diagnose GAD based on your symptoms and medical history. You will also have a physical exam. The health care provider will ask specific questions about your symptoms, including how severe they are, when they started, and if they come and go. Your health care  provider may ask you about your use of alcohol or drugs, including prescription medicines. Your health care provider may refer you to a mental health specialist for further evaluation. Your health care provider will do a thorough examination and may perform additional tests to rule out other possible causes of your symptoms. To be diagnosed with GAD, a person must have anxiety that:  Is out of his or her control.  Affects several different aspects of his or her life, such as work and relationships.  Causes distress that makes him or her unable to take part in normal activities.  Includes at least three physical symptoms of GAD, such as restlessness, fatigue, trouble concentrating, irritability, muscle tension, or sleep problems.  Before your health care provider can confirm a diagnosis of GAD, these symptoms must be present more days than they are not, and they must last for six months or longer. How is this treated? The following therapies are usually used to treat GAD:  Medicine. Antidepressant medicine is usually prescribed for long-term daily control. Antianxiety medicines may be added in severe cases, especially when panic attacks occur.  Talk therapy (psychotherapy). Certain types of talk therapy can be helpful in treating GAD by providing support, education, and guidance. Options include: ? Cognitive behavioral therapy (CBT). People learn coping skills and techniques to ease their anxiety. They learn to identify unrealistic or negative thoughts and behaviors and to replace them with positive ones. ? Acceptance and commitment therapy (ACT). This treatment teaches people how to be mindful as a way to cope with unwanted thoughts and feelings. ? Biofeedback. This process trains you to   manage your body's response (physiological response) through breathing techniques and relaxation methods. You will work with a therapist while machines are used to monitor your physical symptoms.  Stress  management techniques. These include yoga, meditation, and exercise.  A mental health specialist can help determine which treatment is best for you. Some people see improvement with one type of therapy. However, other people require a combination of therapies. Follow these instructions at home:  Take over-the-counter and prescription medicines only as told by your health care provider.  Try to maintain a normal routine.  Try to anticipate stressful situations and allow extra time to manage them.  Practice any stress management or self-calming techniques as taught by your health care provider.  Do not punish yourself for setbacks or for not making progress.  Try to recognize your accomplishments, even if they are small.  Keep all follow-up visits as told by your health care provider. This is important. Contact a health care provider if:  Your symptoms do not get better.  Your symptoms get worse.  You have signs of depression, such as: ? A persistently sad, cranky, or irritable mood. ? Loss of enjoyment in activities that used to bring you joy. ? Change in weight or eating. ? Changes in sleeping habits. ? Avoiding friends or family members. ? Loss of energy for normal tasks. ? Feelings of guilt or worthlessness. Get help right away if:  You have serious thoughts about hurting yourself or others. If you ever feel like you may hurt yourself or others, or have thoughts about taking your own life, get help right away. You can go to your nearest emergency department or call:  Your local emergency services (911 in the [place]  A suicide crisis helpline, such as the [place] at 727 444 4407. This is open 24 hours a day.  Summary  Generalized anxiety disorder (GAD) is a mental health disorder that involves worry that is not triggered by a specific event.  People with GAD often worry excessively about many things in their lives, such as their health and  family.  GAD may cause physical symptoms such as restlessness, trouble concentrating, sleep problems, frequent sweating, nausea, diarrhea, headaches, and trembling or muscle twitching.  A mental health specialist can help determine which treatment is best for you. Some people see improvement with one type of therapy. However, other people require a combination of therapies. This information is not intended to replace advice given to you by your health care provider. Make sure you discuss any questions you have with your health care provider. Document Released: 09/12/2012 Document Revised: 04/07/2016 Document Reviewed: 04/07/2016 Elsevier Interactive Patient Education  2018 [employer]. Persistent Depressive Disorder Persistent depressive disorder (PDD) is a mental health condition. PDD causes symptoms of low-level depression for 2 years or longer. It may also be called long-term (chronic) depression or dysthymia. PDD may include episodes of more severe depression that last for about 2 weeks (major depressive disorder or MDD). PDD can affect the way you think, feel, and sleep. This condition may also affect your relationships. You may be more likely to get sick if you have PDD. Symptoms of PDD occur for most of the day and may include:  Feeling tired (fatigue).  Low energy.  Eating too much or too little.  Sleeping too much or too little.  Feeling restless or agitated.  Feeling hopeless.  Feeling worthless or guilty.  Feeling worried or nervous (anxiety).  Trouble concentrating or making decisions.  Low self-esteem.  A negative way of looking at things (outlook).  Not being able to have fun or feel pleasure.  Avoiding interacting with people.  Getting angry or annoyed easily (irritability).  Acting aggressive or angry.  Follow these instructions at home: Activity  Go back to your normal activities as told by your doctor.  Exercise regularly as told by your  doctor. General instructions  Take over-the-counter and prescription medicines only as told by your doctor.  Do not drink alcohol. Or, limit how much alcohol you drink to no more than 1 drink a day for nonpregnant women and 2 drinks a day for men. One drink equals 12 oz of beer, 5 oz of wine, or 1 oz of hard liquor. Alcohol can affect any antidepressant medicines you are taking. Talk with your doctor about your alcohol use.  Eat a healthy diet and get plenty of sleep.  Find activities that you enjoy each day.  Consider joining a support group. Your doctor may be able to suggest a support group.  Keep all follow-up visits as told by your doctor. This is important. Where to find more information: The [employer] on Mental Illness  www.nami.org  U.S. [employer] Institute of Mental Health  http://www.maynard.net/www.nimh.nih.gov  National Suicide Prevention Lifeline  325-141-90731-800-273-TALK 567-268-0917(1-630 125 6693). This is free, 24-hour help.  Contact a doctor if:  Your symptoms get worse.  You have new symptoms.  You have trouble sleeping or doing your daily activities. Get help right away if:  You self-harm.  You have serious thoughts about hurting yourself or others.  You see, hear, taste, smell, or feel things that are not there (hallucinate). This information is not intended to replace advice given to you by your health care provider. Make sure you discuss any questions you have with your health care provider. Document Released: 04/29/2015 Document Revised: 01/10/2016 Document Reviewed: 01/10/2016 Elsevier Interactive Patient Education  2017 [employer]

## 2016-11-19 LAB — CMP14+EGFR
ALT: 16 IU/L (ref 0–32)
AST: 21 IU/L (ref 0–40)
Albumin/Globulin Ratio: 1.3 (ref 1.2–2.2)
Albumin: 4.7 g/dL (ref 3.5–5.5)
Alkaline Phosphatase: 142 IU/L — ABNORMAL HIGH (ref 39–117)
BUN/Creatinine Ratio: 12 (ref 9–23)
BUN: 8 mg/dL (ref 6–20)
Bilirubin Total: 0.3 mg/dL (ref 0.0–1.2)
CALCIUM: 10.1 mg/dL (ref 8.7–10.2)
CO2: 25 mmol/L (ref 20–29)
Chloride: 99 mmol/L (ref 96–106)
Creatinine, Ser: 0.65 mg/dL (ref 0.57–1.00)
GFR calc non Af Amer: 126 mL/min/{1.73_m2} (ref >59)
GFR, EST AFRICAN AMERICAN: 146 mL/min/{1.73_m2} (ref >59)
GLUCOSE: 78 mg/dL (ref 65–99)
Globulin, Total: 3.7 g/dL (ref 1.5–4.5)
Potassium: 4.6 mmol/L (ref 3.5–5.2)
Sodium: 142 mmol/L (ref 134–144)
TOTAL PROTEIN: 8.4 g/dL (ref 6.0–8.5)

## 2016-11-19 LAB — LIPID PANEL
CHOL/HDL RATIO: 3.5 ratio (ref 0.0–4.4)
Cholesterol, Total: 197 mg/dL (ref 100–199)
HDL: 56 mg/dL (ref >39)
LDL Calculated: 122 mg/dL — ABNORMAL HIGH (ref 0–99)
TRIGLYCERIDES: 97 mg/dL (ref 0–149)
VLDL CHOLESTEROL CAL: 19 mg/dL (ref 5–40)

## 2016-11-19 LAB — CBC WITH DIFFERENTIAL/PLATELET
BASOS ABS: 0 10*3/uL (ref 0.0–0.2)
BASOS: 0 %
EOS (ABSOLUTE): 0.1 10*3/uL (ref 0.0–0.4)
Eos: 1 %
Hematocrit: 34.3 % (ref 34.0–46.6)
Hemoglobin: 10.3 g/dL — ABNORMAL LOW (ref 11.1–15.9)
IMMATURE GRANULOCYTES: 0 %
Immature Grans (Abs): 0 10*3/uL (ref 0.0–0.1)
LYMPHS: 22 %
Lymphocytes Absolute: 2.5 10*3/uL (ref 0.7–3.1)
MCH: 20.4 pg — ABNORMAL LOW (ref 26.6–33.0)
MCHC: 30 g/dL — ABNORMAL LOW (ref 31.5–35.7)
MCV: 68 fL — ABNORMAL LOW (ref 79–97)
Monocytes Absolute: 0.6 10*3/uL (ref 0.1–0.9)
Monocytes: 6 %
NEUTROS PCT: 71 %
Neutrophils Absolute: 8.1 10*3/uL — ABNORMAL HIGH (ref 1.4–7.0)
PLATELETS: 392 10*3/uL — AB (ref 150–379)
RBC: 5.05 x10E6/uL (ref 3.77–5.28)
RDW: 19.1 % — AB (ref 12.3–15.4)
WBC: 11.3 10*3/uL — AB (ref 3.4–10.8)

## 2016-11-19 LAB — TSH: TSH: 1.34 u[IU]/mL (ref 0.450–4.500)

## 2016-11-24 ENCOUNTER — Other Ambulatory Visit: Payer: Self-pay | Admitting: Internal Medicine

## 2016-11-24 MED ORDER — FERROUS SULFATE 325 (65 FE) MG PO TABS: 325.0000 mg | ORAL_TABLET | Freq: Every day | ORAL | 3 refills | Status: DC

## 2016-11-25 ENCOUNTER — Telehealth: Payer: Self-pay | Admitting: *Deleted

## 2016-11-25 NOTE — Telephone Encounter (Signed)
MA informed patient of labs showing iron deficiency.  Patient is aware of supplement being sent to the [facility]. Medical Assistant left message on patient's home and cell voicemail. Voicemail states to give a call back to [place] [facility] at [phone].

## 2016-11-25 NOTE — Telephone Encounter (Signed)
-----   Message from Quentin Angstlugbemiga E Jegede, MD sent at 11/24/2016  3:21 PM EDT ----- Please inform patient that her lab results showed iron deficiency which may be from heavy menses. Iron tablets prescribed to the pharmacy for pick up

## 2017-02-15 ENCOUNTER — Ambulatory Visit: Payer: Self-pay

## 2017-02-15 ENCOUNTER — Ambulatory Visit: Payer: Self-pay | Admitting: Internal Medicine

## 2017-02-17 ENCOUNTER — Ambulatory Visit: Payer: Self-pay | Admitting: Internal Medicine

## 2017-02-24 ENCOUNTER — Ambulatory Visit: Payer: Self-pay | Attending: Internal Medicine | Admitting: Internal Medicine

## 2017-02-24 ENCOUNTER — Encounter: Payer: Self-pay | Admitting: Internal Medicine

## 2017-02-24 ENCOUNTER — Ambulatory Visit: Payer: Self-pay | Admitting: Licensed Clinical Social Worker

## 2017-02-24 ENCOUNTER — Ambulatory Visit: Payer: Self-pay

## 2017-02-24 VITALS — BP 130/84 | HR 107 | Temp 99.0°F | Resp 18 | Ht 67.0 in | Wt 318.0 lb

## 2017-02-24 DIAGNOSIS — J069 Acute upper respiratory infection, unspecified: Secondary | ICD-10-CM

## 2017-02-24 DIAGNOSIS — Z79899 Other long term (current) drug therapy: Secondary | ICD-10-CM | POA: Insufficient documentation

## 2017-02-24 DIAGNOSIS — F419 Anxiety disorder, unspecified: Secondary | ICD-10-CM

## 2017-02-24 DIAGNOSIS — F329 Major depressive disorder, single episode, unspecified: Secondary | ICD-10-CM

## 2017-02-24 DIAGNOSIS — F32A Depression, unspecified: Secondary | ICD-10-CM

## 2017-02-24 HISTORY — DX: Acute upper respiratory infection, unspecified: J06.9

## 2017-02-24 MED ORDER — AZITHROMYCIN 250 MG PO TABS: ORAL_TABLET | ORAL | 0 refills | Status: DC

## 2017-02-24 MED ORDER — GUAIFENESIN-DM 100-10 MG/5ML PO SYRP: 5.0000 mL | ORAL_SOLUTION | Freq: Four times a day (QID) | ORAL | 0 refills | Status: DC | PRN

## 2017-02-24 MED ORDER — ESCITALOPRAM OXALATE 10 MG PO TABS: 10.0000 mg | ORAL_TABLET | Freq: Every day | ORAL | 3 refills | Status: DC

## 2017-02-24 NOTE — BH Specialist Note (Signed)
Integrated Behavioral Health Follow Up Visit  MRN: [id number] Name: Starkeisha Kallie Locks  Number of Integrated Behavioral Health Clinician visits: 2/6 Session Start time: 5:05 PM  Session End time: 5:20 PM Total time: 15 minutes  Type of Service: Integrated Behavioral Health- Individual/Family Interpretor:No. Interpretor Name and Language: N/A  SUBJECTIVE: Viviann R Zaugg is a 21 y.o. female accompanied by [facility] Patient was referred by Dr. Hyman Hopes for depression and anxiety. Patient reports the following symptoms/concerns: overwhelming feelings of sadness and worry, difficulty sleeping, low energy, difficulty focusing, irritability, and hx of suicidal ideations Duration of problem: Ongoing; Severity of problem: severe  OBJECTIVE: Mood: Appropriate and Affect: Appropriate Risk of harm to self or others: No plan to harm self or others  LIFE CONTEXT: Family and Social: Pt resides with grandmother and grandmother's spouse. She receives limited support from mother and sister School/Work: Pt is unemployed. She has an [employer] Self-Care: Pt enjoys listening to music and caring for her pet dog. Pt uses substances (alcohol) Life Changes: Pt experiencing financial strain and has limited support  GOALS ADDRESSED: Patient will: 1.  Reduce symptoms of: anxiety and depression  2.  Increase knowledge and/or ability of: coping skills  3.  Demonstrate ability to: Increase adequate support systems for patient/family  INTERVENTIONS: Interventions utilized:  Solution-Focused Strategies, Supportive Counseling and Link to [facility] Standardized Assessments completed: GAD-7 and PHQ 2&9 with C-SSRS  ASSESSMENT: Patient currently experiencing depression and anxiety triggered by substance use and financial strain. Patient reports overwhelming feelings of sadness and worry, difficulty sleeping, low energy, difficulty focusing, irritability, and hx of suicidal ideations. She denied current  SI/HI/AVH. Pt has limited support and reports difficulty maintaining employment due to anxiety.   Patient may benefit from psychotherapy and medication management. She reports medication for depression and anxiety are not working. LCSWA provided therapeutic interventions to assist in managing symptoms, in addition, to community resources for psychotherapy. Pt has been referred to psych and is aware of crisis intervention resources.   PLAN: 1. Follow up with behavioral health clinician on : Pt was encouraged tocontact LCSWA if symptoms worsen or fail to improveto schedule behavioral appointments at [facility]. 2. Behavioral recommendations: LCSWA recommends that pt apply healthy coping skills discussed, comply with medication management, and utilize provided resources. Pt is encouraged to schedule follow up appointment with LCSWA 3. Referral(s): Paramedic (LME/Outside Clinic) and Psychiatrist 4. "From scale of 1-10, how likely are you to follow plan?": 7/10  Bridgett Larsson, LCSW 02/26/17  5:19 PM

## 2017-02-24 NOTE — Progress Notes (Signed)
Carrie Allen, is a 21 y.o. female  ZOX:[id number]  [id number]  DOB - 03-07-1996  Chief Complaint  Patient presents with  . Depression      Subjective:   Carrie Allen is a 21 y.o. female with history of morbid obesity, Major depression, generalized anxiety and sleep apnea on CPAP machine but inconsistently used presents here today for a follow up visit of depression and anxiety. Since the last visit, patient has not noticed any change in her level of depression and anxiety, as a result she stopped taking prescribed medication within 2 weeks of prescription. Grandmother claims she may do well with Lexapro as they have experience with this medication with other sibling. She continues to have overwhelming feelings of sadness and worry, difficulty sleeping, low energy, difficulty focusing, highly irritable. She has history of suicidal ideations but not at this time. She was accompanied by grandmother today. She also complained of symptoms of upper respiratory infection with cough, productive of yellowish sputum and stuffy nose as well as on and off feverish feeling. Patient has No headache, No chest pain, No abdominal pain - No Nausea, No new weakness tingling or numbness.  Problem  URI, Acute   ALLERGIES: No Known Allergies  PAST MEDICAL HISTORY: Past Medical History:  Diagnosis Date  . Obesity   . Sleep apnea     MEDICATIONS AT HOME: Prior to Admission medications   Medication Sig Start Date End Date Taking? Authorizing Provider  betamethasone dipropionate (DIPROLENE) 0.05 % cream Apply topically 2 (two) times daily. 11/18/16  Yes Quentin Angst, MD  cetirizine (ZYRTEC) 10 MG tablet Take one tablet at bedtime every day for allergies 10/03/15  Yes Gregor Hams, NP  diphenhydrAMINE (BENADRYL) 25 MG tablet Take 1 tablet (25 mg total) by mouth every 6 (six) hours as needed. 02/13/16  Yes Quentin Angst, MD  ferrous sulfate (FERROUSUL) 325 (65 FE) MG tablet Take 1  tablet (325 mg total) by mouth daily with breakfast. 11/24/16  Yes Quentin Angst, MD  fluticasone (FLONASE) 50 MCG/ACT nasal spray 2 sprays each nostril every morning for allergies with congestion 10/03/15  Yes Gregor Hams, NP  hydrOXYzine (ATARAX/VISTARIL) 25 MG tablet Take 1 tablet (25 mg total) by mouth 3 (three) times daily as needed. 11/18/16  Yes Quentin Angst, MD  Olopatadine HCl (PATADAY) 0.2 % SOLN One drop in each eye once a day for allergies 10/03/15  Yes Gregor Hams, NP  omeprazole (PRILOSEC) 20 MG capsule Take 1 capsule (20 mg total) by mouth daily. 10/22/15  Yes Millican, Neysa Bonito, NP  azithromycin (ZITHROMAX) 250 MG tablet Take 2 tablets today, then 1 tablet daily for 4 days from tomorrow 02/24/17   Quentin Angst, MD  escitalopram (LEXAPRO) 10 MG tablet Take 1 tablet (10 mg total) by mouth daily. 02/24/17   Quentin Angst, MD  guaiFENesin-dextromethorphan (ROBITUSSIN DM) 100-10 MG/5ML syrup Take 5 mLs by mouth every 6 (six) hours as needed for cough. 02/24/17   Quentin Angst, MD  naproxen (NAPROSYN) 500 MG tablet Take 1 tablet (500 mg total) by mouth 2 (two) times daily. Patient not taking: Reported on 02/13/2016 02/10/16   Janne Napoleon, NP  triamcinolone ointment (KENALOG) 0.5 % Apply 1 application topically 2 (two) times daily. For moderate to severe eczema.  Do not use for more than 1 week at a time. Patient not taking: Reported on 11/18/2016 09/16/15   Kalman Jewels, MD    Objective:   Vitals:   02/24/17 1615  BP: 130/84  Pulse: (!) 107  Resp: 18  Temp: 99 F (37.2 C)  TempSrc: Oral  SpO2: 98%  Weight: (!) 318 lb (144.2 kg)  Height:  (1.702 m)   Exam General appearance : Awake, alert, not in any distress. Speech Clear. Not toxic looking, No eye contact, obese HEENT: Atraumatic and Normocephalic, pupils equally reactive to light and accomodation Neck: Supple, no JVD. No cervical lymphadenopathy.  Chest: Good air entry  bilaterally, no added sounds  CVS: S1 S2 regular, no murmurs.  Abdomen: Bowel sounds present, Non tender and not distended with no gaurding, rigidity or rebound. Extremities: B/L Lower Ext shows no edema, both legs are warm to touch Neurology: Awake alert, and oriented X 3, CN II-XII intact, Non focal Skin: No Rash  Data Review Lab Results  Component Value Date   HGBA1C 5.9 11/18/2016   Assessment & Plan   1. Anxiety and depression - Discontinue previous medication, try Lexapro  - escitalopram (LEXAPRO) 10 MG tablet; Take 1 tablet (10 mg total) by mouth daily.  Dispense: 30 tablet; Refill: 3  - Ambulatory referral to Psychiatry  2. Morbid obesity due to excess calories (HCC)  Aim for 30 minutes of exercise most days. Rethink what you drink. Water is great! Aim for 2-3 Carb Choices per meal (30-45 grams) +/- 1 either way  Aim for 0-15 Carbs per snack if hungry  Include protein in moderation with your meals and snacks  Consider reading food labels for Total Carbohydrate and Fat Grams of foods  Consider checking BG at alternate times per day  Continue taking medication as directed Be mindful about how much sugar you are adding to beverages and other foods. Fruit Punch - find one with no sugar  Measure and decrease portions of carbohydrate foods  Make your plate and don't go back for seconds  3. URI, acute  - azithromycin (ZITHROMAX) 250 MG tablet; Take 2 tablets today, then 1 tablet daily for 4 days from tomorrow  Dispense: 6 tablet; Refill: 0 - guaiFENesin-dextromethorphan (ROBITUSSIN DM) 100-10 MG/5ML syrup; Take 5 mLs by mouth every 6 (six) hours as needed for cough.  Dispense: 480 mL; Refill: 0  Patient have been counseled extensively about nutrition and exercise. Other issues discussed during this visit include: low cholesterol diet, weight control and daily exercise, importance of adherence with medications and regular follow-up.   Return in about 3 months (around  05/26/2017) for Depression and Anxiety.  The patient was given clear instructions to go to ER or return to medical center if symptoms don't improve, worsen or new problems develop. The patient verbalized understanding. The patient was told to call to get lab results if they haven't heard anything in the next week.   This note has been created with Education officer, environmental. Any transcriptional errors are unintentional.    Jeanann Lewandowsky, MD, MHA, Maxwell Caul, CPE [facility] [place], [place] [phone]   02/24/2017, 5:31 PM

## 2017-02-24 NOTE — Patient Instructions (Signed)
Panic Attacks Panic attacks are sudden, short feelings of great fear or discomfort. You may have them for no reason when you are relaxed, when you are uneasy (anxious), or when you are sleeping. Follow these instructions at home:  Take all your medicines as told.  Check with your doctor before starting new medicines.  Keep all doctor visits. Contact a doctor if:  You are not able to take your medicines as told.  Your symptoms do not get better.  Your symptoms get worse. Get help right away if:  Your attacks seem different than your normal attacks.  You have thoughts about hurting yourself or others.  You take panic attack medicine and you have a side effect. This information is not intended to replace advice given to you by your health care provider. Make sure you discuss any questions you have with your health care provider. Document Released: 06/20/2010 Document Revised: 10/24/2015 Document Reviewed: 12/30/2012 Elsevier Interactive Patient Education  2017 Elsevier Inc.  

## 2017-02-25 MED FILL — ESCITALOPRAM 10 MG TABLET: 10 | 30 days supply | Qty: 30 | Fill #0

## 2017-02-25 MED FILL — ROBAFEN-DM SYRUP: 100-10 | 23 days supply | Qty: 473 | Fill #0

## 2017-02-25 MED FILL — AZITHROMYCIN 250 MG TABLET: 250 | 5 days supply | Qty: 6 | Fill #0

## 2017-03-18 ENCOUNTER — Ambulatory Visit (INDEPENDENT_AMBULATORY_CARE_PROVIDER_SITE_OTHER): Payer: No Typology Code available for payment source | Admitting: Licensed Clinical Social Worker

## 2017-03-18 ENCOUNTER — Encounter (HOSPITAL_COMMUNITY): Payer: Self-pay | Admitting: Licensed Clinical Social Worker

## 2017-03-18 DIAGNOSIS — F331 Major depressive disorder, recurrent, moderate: Secondary | ICD-10-CM

## 2017-03-18 DIAGNOSIS — F4010 Social phobia, unspecified: Secondary | ICD-10-CM

## 2017-03-18 NOTE — Progress Notes (Signed)
   THERAPIST PROGRESS NOTE  Session Time: 11:00am-12:00pm  Participation Level: Active  Behavioral Response: NeatAlertDepressed  Type of Therapy: Family Therapy  Treatment Goals addressed: Anxiety and Diagnosis: Major Depressive Disorder, recurrent episode, Moderate  Interventions: CBT and Motivational Interviewing  Summary: Carrie Allen is a 21 y.o. female who presents with Major Depressive Disorder, recurrent episode, moderate, and Social Anxiety Disorder.   Suicidal/Homicidal: Nowithout intent/plan  Therapist Response: Marvin and her grandmother engaged well in [employer]. Angelita and grandmother report depression on and off since 1st grade. Fayelynn has been in the legal guardianship of grandparents since age 89 or 89. Mother has been unstable for [name]'s entire life, using drugs and homelessness. Vania has some trauma history, but no PTSD sxs. Shanera reports lack of motivation to find work, depressed mood, some social drinking with friends.   Plan: Return again in 2-3 weeks.  Diagnosis: Axis I: Major Depressive Disorder, recurrent episode, moderate Social Anxiety Disorder       Veneda MelterJessica R Mayling Aber, LCSW 03/18/2017
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
## 2017-03-20 IMAGING — DX DG KNEE COMPLETE 4+V*R*
4 series · 4 of 4 positions shown · non-contrast
Comparison: None.

CLINICAL DATA: Fall yesterday with right knee pain.

EXAM:
RIGHT KNEE - COMPLETE 4+ VIEW

[t knee ap right]
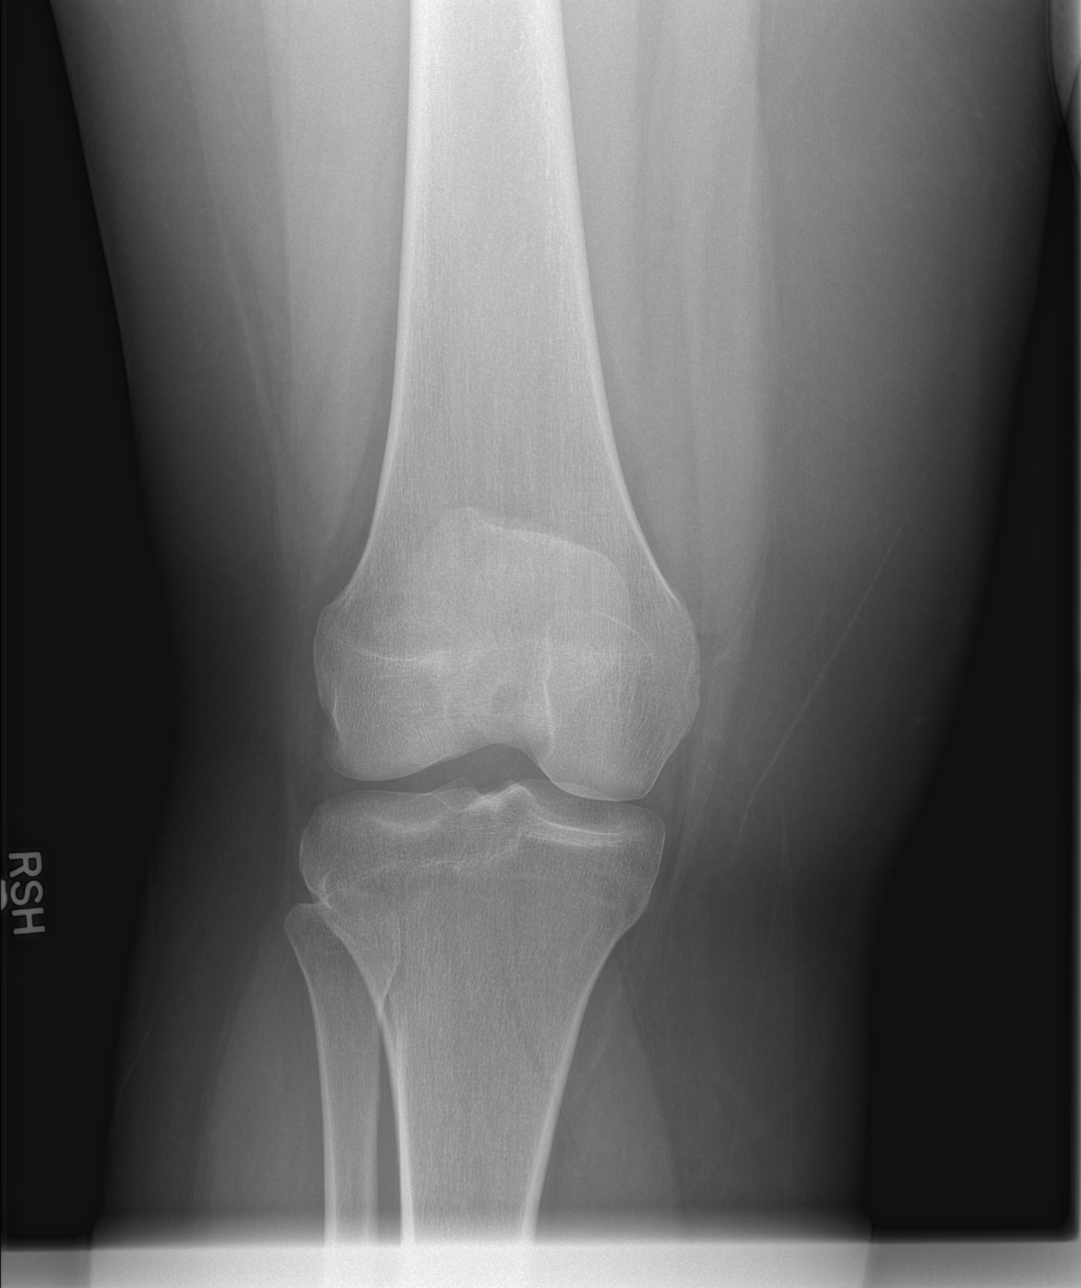

[t knee obl right (1 of 2)]
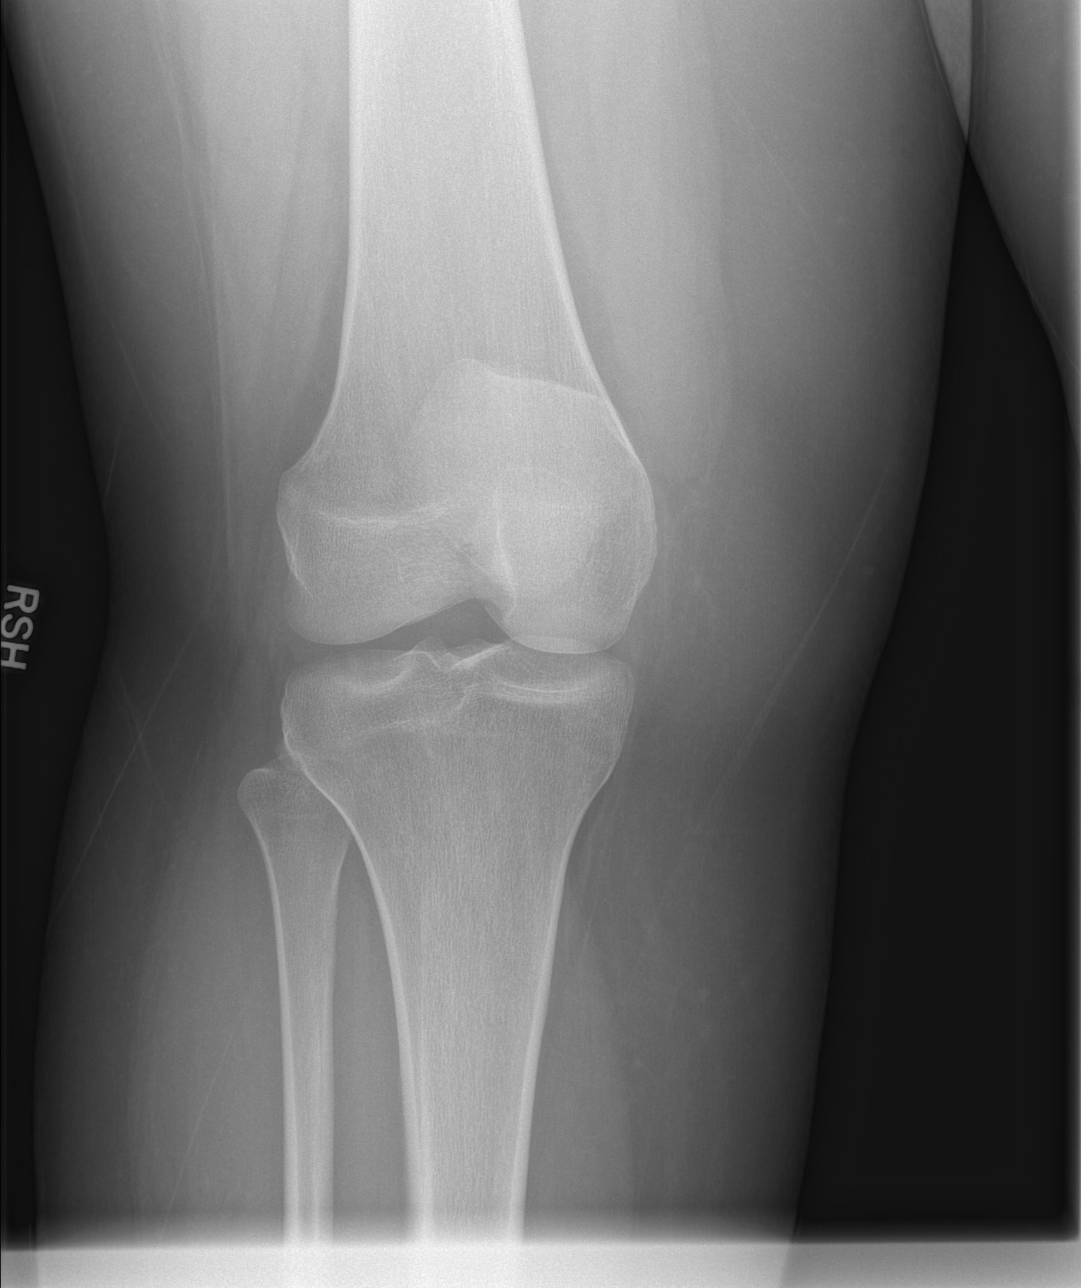

[t knee obl right (2 of 2)]
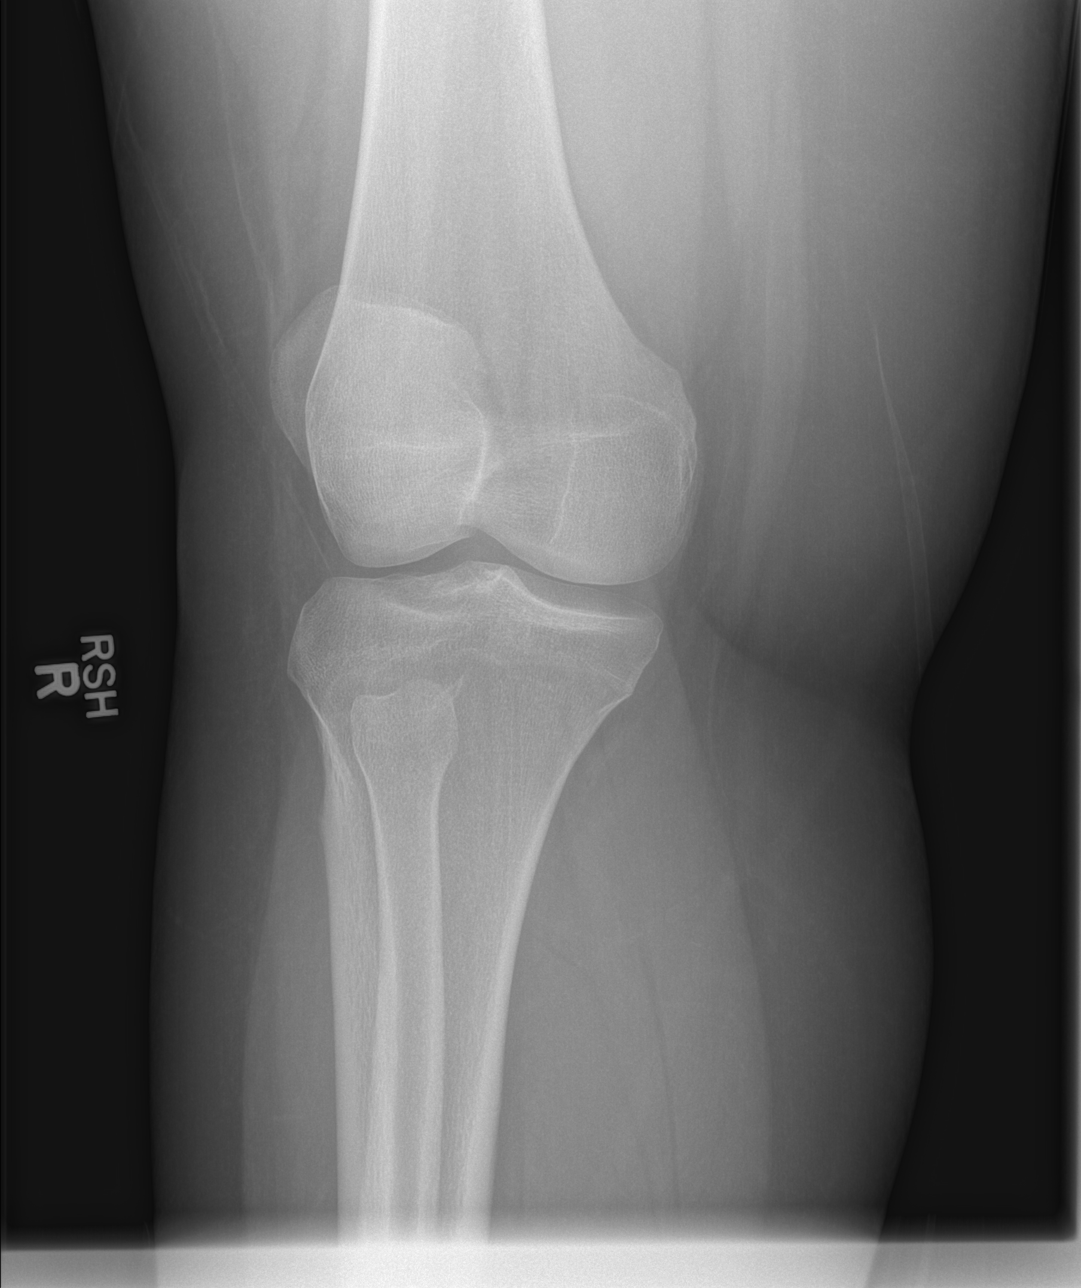

[t knee lat right]
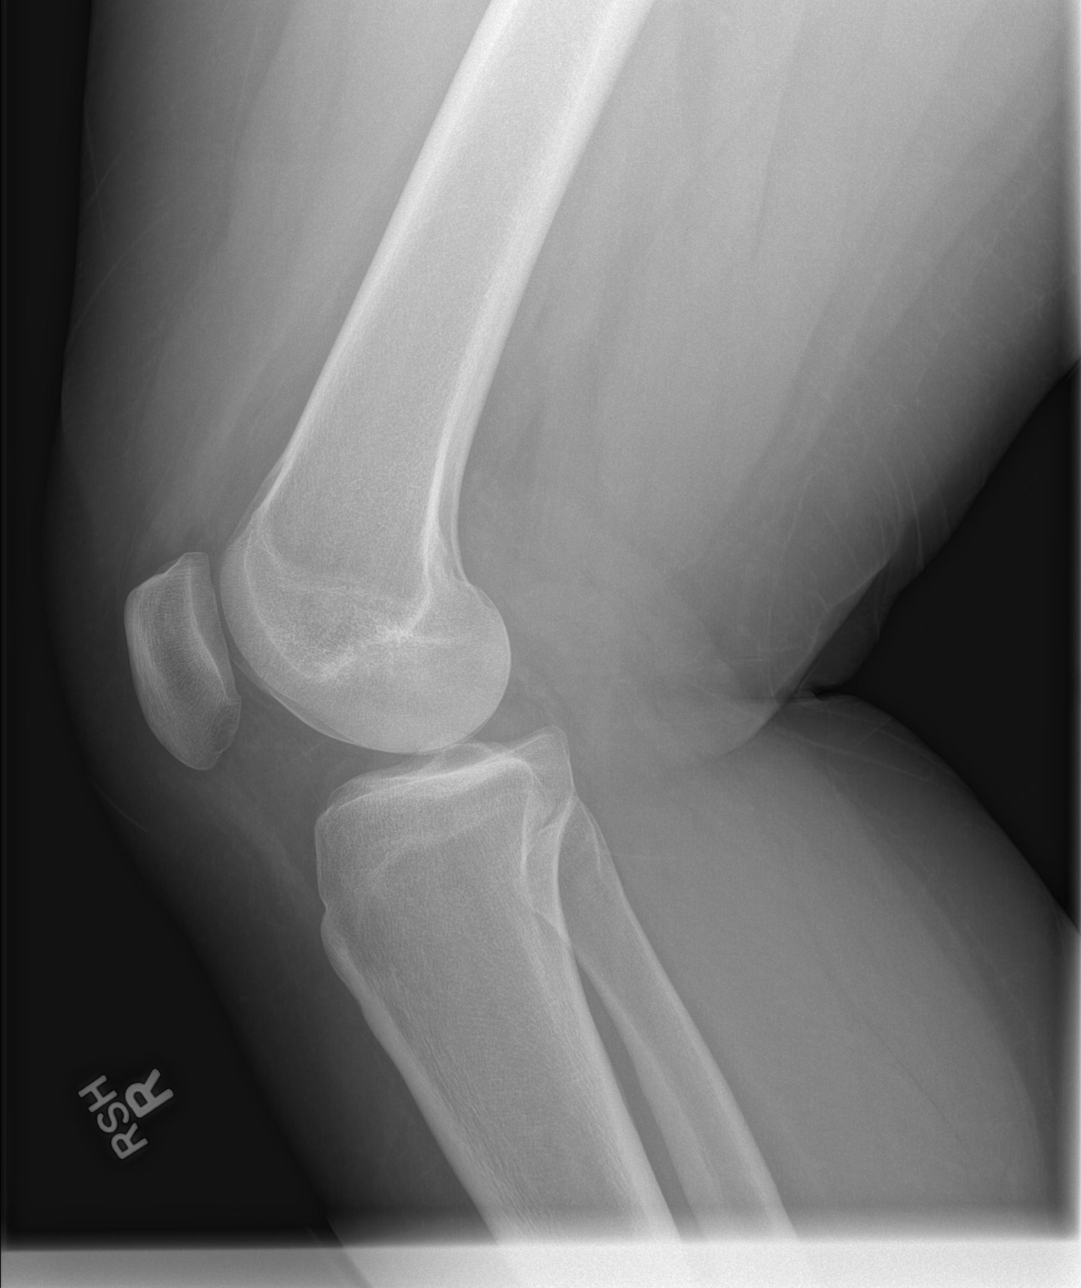

[4 of 4 positions shown; findings below may reference images not displayed]

FINDINGS: No evidence of fracture, dislocation, or joint effusion. No evidence
of arthropathy or other focal bone abnormality. Soft tissues are
unremarkable.
IMPRESSION: Negative.

## 2017-03-31 MED FILL — ESCITALOPRAM 10 MG TABLET: 10 | 30 days supply | Qty: 30 | Fill #1

## 2017-04-14 ENCOUNTER — Ambulatory Visit (HOSPITAL_COMMUNITY): Payer: Self-pay | Admitting: Licensed Clinical Social Worker

## 2017-04-28 ENCOUNTER — Encounter (HOSPITAL_COMMUNITY): Payer: Self-pay | Admitting: Licensed Clinical Social Worker

## 2017-04-28 ENCOUNTER — Ambulatory Visit (INDEPENDENT_AMBULATORY_CARE_PROVIDER_SITE_OTHER): Payer: No Typology Code available for payment source | Admitting: Licensed Clinical Social Worker

## 2017-04-28 DIAGNOSIS — F331 Major depressive disorder, recurrent, moderate: Secondary | ICD-10-CM

## 2017-04-28 DIAGNOSIS — F4010 Social phobia, unspecified: Secondary | ICD-10-CM

## 2017-04-28 NOTE — Progress Notes (Signed)
   THERAPIST PROGRESS NOTE  Session Time: 11:00am-12:00pm  Participation Level: Active  Behavioral Response: CasualLethargicDepressed  Type of Therapy: Individual Therapy  Treatment Goals addressed: "I want to be happier with life and happier with myself".   Interventions: Motivational Interviewing, CBT, Grounding & Mindfulness Techniques, psychoeducation  Summary: Lurene R Paxson is a 21 y.o. female who presents with Major Depressive Disorder, recurrent episode, moderate and Social Anxiety Disorder.  Suicidal/Homicidal: No - without intent/plan  Therapist Response: Luther met with clinician for an individual session.  Adasyn discussed her psychiatric symptoms, her current life events and her homework. Chamia shared that she moved out of her grandparents' house a couple of weeks ago and is now living with her best friend and boyfriend. Ladeidra reports she has not been taking her medication since she moved out and her anxiety has been worse. Analy agreed to go back on her medication after consulting with her doctor to ensure safety. Clinician and Wilma worked together to create treatment plan and noted wanting to get a job and eventually get her own apartment as personal goals. Tajah reports she left her grandparents' house without the blessing of grandmother. However, after a visit this morning, things were better and they got along well.   Plan: Return again in 2-3 weeks  Diagnosis:     Axis I: Major Depressive Disorder, recurrent episode, moderate                            Mindi Curling, LCSW 04/28/2017

## 2017-05-12 ENCOUNTER — Ambulatory Visit (HOSPITAL_COMMUNITY): Payer: Self-pay | Admitting: Licensed Clinical Social Worker

## 2017-05-19 ENCOUNTER — Ambulatory Visit: Payer: Self-pay | Admitting: Internal Medicine

## 2017-05-21 DIAGNOSIS — K219 Gastro-esophageal reflux disease without esophagitis: Secondary | ICD-10-CM

## 2017-05-26 ENCOUNTER — Ambulatory Visit: Payer: Self-pay | Admitting: Internal Medicine

## 2017-06-21 ENCOUNTER — Ambulatory Visit (HOSPITAL_COMMUNITY): Payer: Self-pay | Admitting: Psychiatry

## 2017-06-29 DIAGNOSIS — Z0271 Encounter for disability determination: Secondary | ICD-10-CM

## 2017-10-19 ENCOUNTER — Encounter: Payer: Self-pay | Admitting: Family

## 2017-10-20 ENCOUNTER — Ambulatory Visit (INDEPENDENT_AMBULATORY_CARE_PROVIDER_SITE_OTHER): Payer: Medicaid Other | Admitting: Family

## 2017-10-20 ENCOUNTER — Encounter: Payer: Self-pay | Admitting: Family

## 2017-10-20 VITALS — BP 140/88 | HR 80 | Ht 67.91 in | Wt 318.2 lb

## 2017-10-20 DIAGNOSIS — Z978 Presence of other specified devices: Secondary | ICD-10-CM

## 2017-10-20 DIAGNOSIS — N898 Other specified noninflammatory disorders of vagina: Secondary | ICD-10-CM

## 2017-10-20 DIAGNOSIS — Z113 Encounter for screening for infections with a predominantly sexual mode of transmission: Secondary | ICD-10-CM | POA: Diagnosis not present

## 2017-10-20 DIAGNOSIS — Z130 Encounter for screening for diseases of the blood and blood-forming organs and certain disorders involving the immune mechanism: Secondary | ICD-10-CM | POA: Diagnosis not present

## 2017-10-20 DIAGNOSIS — N921 Excessive and frequent menstruation with irregular cycle: Secondary | ICD-10-CM

## 2017-10-20 DIAGNOSIS — Z975 Presence of (intrauterine) contraceptive device: Secondary | ICD-10-CM

## 2017-10-20 LAB — POCT HEMOGLOBIN: Hemoglobin: 10.2 g/dL — AB (ref 12.2–16.2)

## 2017-10-20 NOTE — Progress Notes (Signed)
History was provided by the patient.  Carrie Allen is a 22 y.o. female who is here for breakthrough bleeding with nexplanon.   PCP confirmed? No.  Quentin Angst, MD  HPI:   -been bleeding for about 2 weeks  -not heavy  -no pain with intercourse -no abdominal pain or pelvic pain  -no vaginal lesions, or vaginal discharge pain -no new meds  -wants to keep nexplanon if it is just an infection  Review of Systems  Constitutional: Negative for malaise/fatigue.  Eyes: Negative for double vision.  Respiratory: Negative for shortness of breath.   Cardiovascular: Negative for chest pain and palpitations.  Gastrointestinal: Negative for abdominal pain, constipation, diarrhea, nausea and vomiting.  Genitourinary: Negative for dysuria.  Musculoskeletal: Negative for joint pain and myalgias.  Skin: Negative for rash.  Neurological: Negative for dizziness and headaches.  Endo/Heme/Allergies: Does not bruise/bleed easily.     Patient Active Problem List   Diagnosis Date Noted  . URI, acute 02/24/2017  . Anxiety and depression 11/18/2016  . Intrinsic atopic dermatitis 11/18/2016  . Knee pain, acute 02/13/2016  . OSA (obstructive sleep apnea) 10/10/2015  . Gastroesophageal reflux disease without esophagitis 10/08/2015  . Other seasonal allergic rhinitis 10/03/2015  . Allergic conjunctivitis 10/03/2015  . Eczema 09/16/2015  . 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 Adjustment disorder with mixed anxiety and depressed mood 04/03/2015  . Morbid obesity due to excess calories [facility])     Current Outpatient Medications on File Prior to Visit  Medication Sig Dispense Refill  . azithromycin (ZITHROMAX) 250 MG tablet Take 2 tablets today, then 1 tablet daily for 4 days from tomorrow (Patient not taking: Reported on 03/18/2017) 6 tablet 0  . betamethasone dipropionate (DIPROLENE) 0.05 % cream Apply topically 2 (two) times daily. 45 g 3  . cetirizine (ZYRTEC) 10 MG tablet Take one tablet at  bedtime every day for allergies 30 tablet 11  . diphenhydrAMINE (BENADRYL) 25 MG tablet Take 1 tablet (25 mg total) by mouth every 6 (six) hours as needed. (Patient not taking: Reported on 03/18/2017) 30 tablet 0  . escitalopram (LEXAPRO) 10 MG tablet Take 1 tablet (10 mg total) by mouth daily. 30 tablet 3  . ferrous sulfate (FERROUSUL) 325 (65 FE) MG tablet Take 1 tablet (325 mg total) by mouth daily with breakfast. (Patient not taking: Reported on 03/18/2017) 90 tablet 3  . fluticasone (FLONASE) 50 MCG/ACT nasal spray 2 sprays each nostril every morning for allergies with congestion 16 g 12  . guaiFENesin-dextromethorphan (ROBITUSSIN DM) 100-10 MG/5ML syrup Take 5 mLs by mouth every 6 (six) hours as needed for cough. (Patient not taking: Reported on 03/18/2017) 480 mL 0  . hydrOXYzine (ATARAX/VISTARIL) 25 MG tablet Take 1 tablet (25 mg total) by mouth 3 (three) times daily as needed. (Patient not taking: Reported on 03/18/2017) 60 tablet 3  . naproxen (NAPROSYN) 500 MG tablet Take 1 tablet (500 mg total) by mouth 2 (two) times daily. (Patient not taking: Reported on 02/13/2016) 20 tablet 0  . Olopatadine HCl (PATADAY) 0.2 % SOLN One drop in each eye once a day for allergies 1 Bottle 5  . omeprazole (PRILOSEC) 20 MG capsule Take 1 capsule (20 mg total) by mouth daily. (Patient not taking: Reported on 03/18/2017) 90 capsule 0  . triamcinolone ointment (KENALOG) 0.5 % Apply 1 application topically 2 (two) times daily. For moderate to severe eczema.  Do not use for more than 1 week at a time. (Patient not taking: Reported on 11/18/2016) 60  g 3   No current facility-administered medications on file prior to visit.     No Known Allergies  Physical Exam:    Vitals:   10/20/17 1352  BP: 140/88  Pulse: 80  Weight: (!) 318 lb 3.2 oz (144.3 kg)  Height: 5' 7.91" (1.725 m)   Wt Readings from Last 3 Encounters:  10/20/17 (!) 318 lb 3.2 oz (144.3 kg)  02/24/17 (!) 318 lb (144.2 kg)  11/18/16 (!) 306  lb (138.8 kg)    Growth percentile SmartLinks can only be used for patients less than 34 years old. No LMP recorded. Patient has had an injection.  Physical Exam  Constitutional: She is oriented to person, place, and time. She appears well-developed and well-nourished. No distress.  Eyes: Pupils are equal, round, and reactive to light. EOM are normal. No scleral icterus.  Neck: Normal range of motion. Neck supple. No thyromegaly present.  Cardiovascular: Normal rate.  No murmur heard. Pulmonary/Chest: Effort normal.  Musculoskeletal: Normal range of motion. She exhibits no edema or tenderness.  Lymphadenopathy:    She has no cervical adenopathy.  Neurological: She is alert and oriented to person, place, and time. No cranial nerve deficit.  Skin: Skin is warm and dry. No rash noted.  Psychiatric: She has a normal mood and affect.  Nursing note and vitals reviewed.   Assessment/Plan: 1. Breakthrough bleeding on Nexplanon -will screen for infection  -reassurance given no pelvic or abdominal pain   2. Vaginal discharge -as above  - WET PREP BY MOLECULAR PROBE  3. Screening for deficiency anemia Lab Results  Component Value Date   HGB 10.2 (A) 10/20/2017  -iron supplementation prescribed by PCP last summer, anemia is stable from the last check.  -patient not taking iron supplementation -notify patient to restart iron supplementation at phone call with results  - POCT hemoglobin  4. Routine screening for STI (sexually transmitted infection) -as above  - C. trachomatis/N. gonorrhoeae RNA

## 2017-10-21 ENCOUNTER — Other Ambulatory Visit: Payer: Self-pay | Admitting: Family

## 2017-10-21 LAB — WET PREP BY MOLECULAR PROBE
Candida species: NOT DETECTED
MICRO NUMBER:: 90622481
SPECIMEN QUALITY: ADEQUATE
Trichomonas vaginosis: NOT DETECTED

## 2017-10-21 LAB — C. TRACHOMATIS/N. GONORRHOEAE RNA
C. TRACHOMATIS RNA, TMA: NOT DETECTED
N. gonorrhoeae RNA, TMA: NOT DETECTED

## 2017-10-21 MED ORDER — METRONIDAZOLE 500 MG PO TABS: 500.0000 mg | ORAL_TABLET | Freq: Two times a day (BID) | ORAL | 0 refills | Status: DC

## 2017-11-03 ENCOUNTER — Encounter: Payer: Self-pay | Admitting: Family

## 2017-11-08 ENCOUNTER — Encounter: Payer: Self-pay | Admitting: Family

## 2017-11-12 ENCOUNTER — Ambulatory Visit (INDEPENDENT_AMBULATORY_CARE_PROVIDER_SITE_OTHER): Payer: Medicaid Other | Admitting: Family

## 2017-11-12 ENCOUNTER — Encounter: Payer: Self-pay | Admitting: Family

## 2017-11-12 VITALS — BP 127/81 | HR 96 | Ht 68.5 in | Wt 316.8 lb

## 2017-11-12 DIAGNOSIS — Z30014 Encounter for initial prescription of intrauterine contraceptive device: Secondary | ICD-10-CM

## 2017-11-12 DIAGNOSIS — Z975 Presence of (intrauterine) contraceptive device: Secondary | ICD-10-CM

## 2017-11-12 NOTE — Patient Instructions (Signed)
Go to your pharmacy and pick a multi-vitamin with folic acid and start taking it daily.  Return for scheduled appointment for removal.

## 2017-11-12 NOTE — Progress Notes (Signed)
History was provided by the patient.  Carrie Allen is a 22 y.o. female who is here for consult before nexplanon removal.   PCP confirmed? Yes.    Quentin Angst, MD  HPI:   -this is her second nexplanon  -she doesn't like it  -her period are irregular and she doesn't like the unpredictability  -she doesn't want to be on birth control now and would not mind if she got pregnant -mom is with her for this visit.   Review of Systems  Constitutional: Negative for malaise/fatigue.  Eyes: Negative for double vision.  Respiratory: Negative for shortness of breath.   Cardiovascular: Negative for chest pain and palpitations.  Gastrointestinal: Negative for abdominal pain, constipation, diarrhea, nausea and vomiting.  Genitourinary: Negative for dysuria.  Musculoskeletal: Negative for joint pain and myalgias.  Skin: Negative for rash.  Neurological: Negative for dizziness and headaches.  Endo/Heme/Allergies: Does not bruise/bleed easily.      Patient Active Problem List   Diagnosis Date Noted  . URI, acute 02/24/2017  . Anxiety and depression 11/18/2016  . Intrinsic atopic dermatitis 11/18/2016  . Knee pain, acute 02/13/2016  . OSA (obstructive sleep apnea) 10/10/2015  . Gastroesophageal reflux disease without esophagitis 10/08/2015  . Other seasonal allergic rhinitis 10/03/2015  . Allergic conjunctivitis 10/03/2015  . Eczema 09/16/2015  . Adjustment disorder with mixed anxiety and depressed mood 04/03/2015  . Morbid obesity due to excess calories [facility])     Current Outpatient Medications on File Prior to Visit  Medication Sig Dispense Refill  . azithromycin (ZITHROMAX) 250 MG tablet Take 2 tablets today, then 1 tablet daily for 4 days from tomorrow (Patient not taking: Reported on 03/18/2017) 6 tablet 0  . betamethasone dipropionate (DIPROLENE) 0.05 % cream Apply topically 2 (two) times daily. (Patient not taking: Reported on 11/12/2017) 45 g 3  . cetirizine (ZYRTEC) 10  MG tablet Take one tablet at bedtime every day for allergies (Patient not taking: Reported on 11/12/2017) 30 tablet 11  . diphenhydrAMINE (BENADRYL) 25 MG tablet Take 1 tablet (25 mg total) by mouth every 6 (six) hours as needed. (Patient not taking: Reported on 03/18/2017) 30 tablet 0  . escitalopram (LEXAPRO) 10 MG tablet Take 1 tablet (10 mg total) by mouth daily. (Patient not taking: Reported on 11/12/2017) 30 tablet 3  . ferrous sulfate (FERROUSUL) 325 (65 FE) MG tablet Take 1 tablet (325 mg total) by mouth daily with breakfast. (Patient not taking: Reported on 03/18/2017) 90 tablet 3  . fluticasone (FLONASE) 50 MCG/ACT nasal spray 2 sprays each nostril every morning for allergies with congestion (Patient not taking: Reported on 11/12/2017) 16 g 12  . guaiFENesin-dextromethorphan (ROBITUSSIN DM) 100-10 MG/5ML syrup Take 5 mLs by mouth every 6 (six) hours as needed for cough. (Patient not taking: Reported on 03/18/2017) 480 mL 0  . hydrOXYzine (ATARAX/VISTARIL) 25 MG tablet Take 1 tablet (25 mg total) by mouth 3 (three) times daily as needed. (Patient not taking: Reported on 03/18/2017) 60 tablet 3  . metroNIDAZOLE (FLAGYL) 500 MG tablet Take 1 tablet (500 mg total) by mouth 2 (two) times daily. (Patient not taking: Reported on 11/12/2017) 14 tablet 0  . naproxen (NAPROSYN) 500 MG tablet Take 1 tablet (500 mg total) by mouth 2 (two) times daily. (Patient not taking: Reported on 02/13/2016) 20 tablet 0  . Olopatadine HCl (PATADAY) 0.2 % SOLN One drop in each eye once a day for allergies (Patient not taking: Reported on 11/12/2017) 1 Bottle 5  . omeprazole (  PRILOSEC) 20 MG capsule Take 1 capsule (20 mg total) by mouth daily. (Patient not taking: Reported on 03/18/2017) 90 capsule 0  . triamcinolone ointment (KENALOG) 0.5 % Apply 1 application topically 2 (two) times daily. For moderate to severe eczema.  Do not use for more than 1 week at a time. (Patient not taking: Reported on 11/18/2016) 60 g 3   No  current facility-administered medications on file prior to visit.     Allergies  Allergen Reactions  . Nickel     Physical Exam:    Vitals:   11/12/17 1037  BP: 127/81  Pulse: 96  Weight: (!) 316 lb 12.8 oz (143.7 kg)  Height: 5' 8.5" (1.74 m)   Wt Readings from Last 3 Encounters:  11/12/17 (!) 316 lb 12.8 oz (143.7 kg)  10/20/17 (!) 318 lb 3.2 oz (144.3 kg)  02/24/17 (!) 318 lb (144.2 kg)      Growth percentile SmartLinks can only be used for patients less than 23 years old. No LMP recorded. Patient has had an injection.  Physical Exam  Constitutional: She appears well-developed. No distress.  HENT:  Head: Normocephalic and atraumatic.  Neck: Normal range of motion. Neck supple.  Cardiovascular: Normal rate and regular rhythm.  No murmur heard. Pulmonary/Chest: Effort normal and breath sounds normal.  Abdominal: Soft.  Musculoskeletal: She exhibits no edema.  Lymphadenopathy:    She has no cervical adenopathy.  Neurological: She is alert.  Skin: Skin is warm and dry. No rash (eczematous rash bilateral wrists/forearms) noted.     Assessment/Plan: 1. Nexplanon in place -reviewed Tier 1 and Tier 2 options, she declines any other method. -discussed preconception health and need for DMV with folic acid -she is concerned her grandmother will know something if she starts taking one  -reviewed neural tube defects and other preventive reasons for prenatal vitamins before and during pregancy -she will return for removal

## 2017-11-13 ENCOUNTER — Encounter: Payer: Self-pay | Admitting: Family

## 2017-11-25 ENCOUNTER — Ambulatory Visit: Payer: Medicaid Other | Admitting: Pediatrics

## 2017-11-29 ENCOUNTER — Encounter: Payer: Self-pay | Admitting: Family

## 2017-11-29 ENCOUNTER — Encounter: Payer: Self-pay | Admitting: Pediatrics

## 2017-11-29 ENCOUNTER — Ambulatory Visit (INDEPENDENT_AMBULATORY_CARE_PROVIDER_SITE_OTHER): Payer: Medicaid Other | Admitting: Pediatrics

## 2017-11-29 VITALS — BP 148/81 | HR 121 | Ht 68.9 in | Wt 316.2 lb

## 2017-11-29 DIAGNOSIS — Z3046 Encounter for surveillance of implantable subdermal contraceptive: Secondary | ICD-10-CM

## 2017-11-29 NOTE — Patient Instructions (Signed)
Your Nexplanon was removed today and is no longer preventing pregnancy.  If you have sex, remember to use condoms to prevent pregnancy and to prevent sexually transmitted infections.  Leave the outside bandage on for 24 hours.  Leave the smaller bandages on for 3-5 days or until they fall off on their own.  Keep the area clean and dry for 3-5 days.  There is usually bruising or swelling at and around the removal site for a few days to a week after the removal.  If you see redness or pus draining from the removal site, call us immediately.  We would like you to return to the clinic for a follow-up visit in 1 month.  You can call [facility] 24 hours a day with any questions or concerns.  There is always a nurse or doctor available to take your call.  Call 9-1-1 if you have a life-threatening emergency.  For anything else, please call us at [phone] before heading to the ER. 

## 2017-11-29 NOTE — Progress Notes (Signed)

## 2017-12-22 ENCOUNTER — Encounter: Payer: Self-pay | Admitting: Family

## 2017-12-27 ENCOUNTER — Ambulatory Visit: Payer: Medicaid Other | Admitting: Pediatrics

## 2017-12-28 ENCOUNTER — Ambulatory Visit: Payer: Self-pay

## 2018-01-03 ENCOUNTER — Ambulatory Visit: Payer: Medicaid Other | Admitting: Pediatrics

## 2018-01-04 ENCOUNTER — Ambulatory Visit: Payer: Self-pay

## 2018-02-15 ENCOUNTER — Encounter: Payer: Self-pay | Admitting: Family

## 2018-02-28 ENCOUNTER — Encounter

## 2018-02-28 ENCOUNTER — Encounter: Payer: Self-pay | Admitting: Pediatrics

## 2018-02-28 ENCOUNTER — Other Ambulatory Visit: Payer: Self-pay

## 2018-02-28 ENCOUNTER — Ambulatory Visit (INDEPENDENT_AMBULATORY_CARE_PROVIDER_SITE_OTHER): Payer: Medicaid Other | Admitting: Pediatrics

## 2018-02-28 VITALS — BP 130/90 | HR 97 | Ht 68.27 in | Wt 322.4 lb

## 2018-02-28 DIAGNOSIS — Z30017 Encounter for initial prescription of implantable subdermal contraceptive: Secondary | ICD-10-CM

## 2018-02-28 DIAGNOSIS — Z3202 Encounter for pregnancy test, result negative: Secondary | ICD-10-CM | POA: Diagnosis not present

## 2018-02-28 LAB — POCT URINE PREGNANCY: PREG TEST UR: NEGATIVE

## 2018-02-28 MED ORDER — ETONOGESTREL 68 MG ~~LOC~~ IMPL
68.0000 mg | DRUG_IMPLANT | Freq: Once | SUBCUTANEOUS | Status: AC
  Administered 2018-02-28: 68 mg via SUBCUTANEOUS

## 2018-02-28 NOTE — Procedures (Signed)
Nexplanon Insertion  No contraindications for placement.  No liver disease, no unexplained vaginal bleeding, no h/o breast cancer, no h/o blood clots.  Patient's last menstrual period was 02/21/2018.  UHCG: neg  Last Unprotected sex: 02/17/18  Risks & benefits of Nexplanon discussed The nexplanon device was purchased and supplied by [facility]. Packaging instructions supplied to patient Consent form signed  The patient denies any allergies to anesthetics or antiseptics.  Procedure: Pt was placed in supine position. The left arm was flexed at the elbow and externally rotated so that her wrist was parallel to her ear The medial epicondyle of the left arm was identified The insertions site was marked 8 cm proximal to the medial epicondyle The insertion site was cleaned with Betadine The area surrounding the insertion site was covered with a sterile drape 1% lidocaine was injected just under the skin at the insertion site extending 4 cm proximally. The sterile preloaded disposable Nexaplanon applicator was removed from the sterile packaging The applicator needle was inserted at a 30 degree angle at 8 cm proximal to the medial epicondyle as marked The applicator was lowered to a horizontal position and advanced just under the skin for the full length of the needle The slider on the applicator was retracted fully while the applicator remained in the same position, then the applicator was removed. The implant was confirmed via palpation as being in position The implant position was demonstrated to the patient Pressure dressing was applied to the patient.  The patient was instructed to removed the pressure dressing in 24 hrs.  The patient was advised to move slowly from a supine to an upright position  The patient denied any concerns or complaints  The patient was instructed to schedule a follow-up appt in 1 month and to call sooner if any concerns.  The patient acknowledged agreement and  understanding of the plan.

## 2018-02-28 NOTE — Patient Instructions (Signed)
° °  Congratulations on getting your Nexplanon placement!  Below is some important information about Nexplanon. ° °First remember that Nexplanon does not prevent sexually transmitted infections.  Condoms will help prevent sexually transmitted infections. °The Nexplanon starts working 7 days after it was inserted.  There is a risk of getting pregnant if you have unprotected sex in those first 7 days after placement of the Nexplanon. ° °The Nexplanon lasts for 3 years but can be removed at any time.  You can become pregnant as early as 1 week after removal.  You can have a new Nexplanon put in after the old one is removed if you like. ° °It is not known whether Nexplanon is as effective in women who are very overweight because the studies did not include many overweight women. ° °Nexplanon interacts with some medications, including barbiturates, bosentan, carbamazepine, felbamate, griseofulvin, oxcarbazepine, phenytoin, rifampin, St. John's wort, topiramate, HIV medicines.  Please alert your doctor if you are on any of these medicines. ° °Always tell other healthcare providers that you have a Nexplanon in your arm. ° °The Nexplanon was placed just under the skin.  Leave the outside bandage on for 24 hours.  Leave the smaller bandage on for 3-5 days or until it falls off on its own.  Keep the area clean and dry for 3-5 days. °There is usually bruising or swelling at the insertion site for a few days to a week after placement.  If you see redness or pus draining from the insertion site, call us immediately. ° °Keep your user card with the date the implant was placed and the date the implant is to be removed. ° °The most common side effect is a change in your menstrual bleeding pattern.   This bleeding is generally not harmful to you but can be annoying.  Call or come in to see us if you have any concerns about the bleeding or if you have any side effects or questions.   ° °We will call you in 1 week to check in and we  would like you to return to the clinic for a follow-up visit in 1 month. ° °You can call [facility] 24 hours a day with any questions or concerns.  There is always a nurse or doctor available to take your call.  Call 9-1-1 if you have a life-threatening emergency.  For anything else, please call us at [phone] before heading to the ER. °

## 2018-02-28 NOTE — Progress Notes (Signed)
History was provided by the patient.  Carrie Allen is a 22 y.o. female who is here for birth control.  Quentin Angst, MD   HPI:  Pt reports that she would like to get restarted on birth control but is having a hard time choosing an option. She has been tracking her periods with an app. Sexually active 9/19 and then started spotting on Saturday, period started Monday and lasted 4 days.   Doesn't think she will remember the pill- thinks that she wants another implant. No longer with a partner. Living with mom- not currenlty going to school or working.   Periods have been regular for the past 2-3 months.   Patient's last menstrual period was 02/21/2018.  Review of Systems  Constitutional: Negative for malaise/fatigue.  Eyes: Negative for double vision.  Respiratory: Negative for shortness of breath.   Cardiovascular: Negative for chest pain and palpitations.  Gastrointestinal: Negative for abdominal pain, constipation, diarrhea, nausea and vomiting.  Genitourinary: Negative for dysuria.  Musculoskeletal: Negative for joint pain and myalgias.  Skin: Negative for rash.  Neurological: Negative for dizziness and headaches.  Endo/Heme/Allergies: Does not bruise/bleed easily.  Psychiatric/Behavioral: Negative for depression. The patient is not nervous/anxious.     Patient Active Problem List   Diagnosis Date Noted  . URI, acute 02/24/2017  . Anxiety and depression 11/18/2016  . Intrinsic atopic dermatitis 11/18/2016  . Knee pain, acute 02/13/2016  . OSA (obstructive sleep apnea) 10/10/2015  . Gastroesophageal reflux disease without esophagitis 10/08/2015  . Other seasonal allergic rhinitis 10/03/2015  . Allergic conjunctivitis 10/03/2015  . Eczema 09/16/2015  . Adjustment disorder with mixed anxiety and depressed mood 04/03/2015  . Morbid obesity due to excess calories ([facility]CC)     No current outpatient medications on file prior to visit.   No current facility-administered  medications on file prior to visit.     Allergies  Allergen Reactions  . Nickel     Physical Exam:    Vitals:   02/28/18 1442 02/28/18 1448  BP: (!) 138/93 130/90  Pulse: 97   Weight: (!) 322 lb 6.4 oz (146.2 kg)   Height: 5' 8.27" (1.734 m)     Growth percentile SmartLinks can only be used for patients less than 10 years old.  Physical Exam  Constitutional: She appears well-developed. No distress.  HENT:  Mouth/Throat: Oropharynx is clear and moist.  Neck: No thyromegaly present.  Cardiovascular: Normal rate and regular rhythm.  No murmur heard. Pulmonary/Chest: Breath sounds normal.  Abdominal: Soft. She exhibits no mass. There is no tenderness. There is no guarding.  Musculoskeletal: She exhibits no edema.  Lymphadenopathy:    She has no cervical adenopathy.  Neurological: She is alert.  Skin: Skin is warm. No rash noted.  Psychiatric: She has a normal mood and affect.  Nursing note and vitals reviewed.   Assessment/Plan: 1. Insertion of Nexplanon See procedure note. Tolerated well. Has had nexplanon previously and was overall pleased. Has issues with transportation so will not schedule f/u today- she will return PRN.  - etonogestrel (NEXPLANON) implant 68 mg - Subdermal Etonogestrel Implant Insertion  2. Urine pregnancy test negative Negative urine preg and had a period that was regular- no concerns for pregnancy today. - POCT urine pregnancy

## 2018-04-13 ENCOUNTER — Ambulatory Visit (INDEPENDENT_AMBULATORY_CARE_PROVIDER_SITE_OTHER): Payer: Medicaid Other | Admitting: Family

## 2018-04-13 ENCOUNTER — Other Ambulatory Visit (HOSPITAL_COMMUNITY)
Admission: RE | Admit: 2018-04-13 | Discharge: 2018-04-13 | Disposition: A | Discharge status: Home / Self Care | Payer: Self-pay | Source: Ambulatory Visit | Attending: Family | Admitting: Family

## 2018-04-13 ENCOUNTER — Encounter: Payer: Self-pay | Admitting: Family

## 2018-04-13 VITALS — BP 146/87 | HR 100 | Ht 67.72 in | Wt 318.2 lb

## 2018-04-13 DIAGNOSIS — N898 Other specified noninflammatory disorders of vagina: Secondary | ICD-10-CM | POA: Diagnosis not present

## 2018-04-13 DIAGNOSIS — Z124 Encounter for screening for malignant neoplasm of cervix: Secondary | ICD-10-CM | POA: Insufficient documentation

## 2018-04-13 DIAGNOSIS — Z113 Encounter for screening for infections with a predominantly sexual mode of transmission: Secondary | ICD-10-CM

## 2018-04-13 NOTE — Progress Notes (Signed)
History was provided by the patient.  Carrie Allen is a 22 y.o. female who is here for PAP smear and has vaginal discharge  PCP confirmed? Yes.    Quentin AngstJegede, Olugbemiga E, MD  HPI:   -nervous about exam, nervous about PAP  -wants to talk with mom on her phone during exam -endorses malodorous vaginal discharge -no pain with intercourse, no lesions, no abdominal fullness  Review of Systems  Constitutional: Negative for malaise/fatigue.  Eyes: Negative for double vision.  Respiratory: Negative for shortness of breath.   Cardiovascular: Negative for chest pain and palpitations.  Gastrointestinal: Negative for abdominal pain, constipation, diarrhea, nausea and vomiting.  Genitourinary: Negative for dysuria.  Musculoskeletal: Negative for joint pain and myalgias.  Skin: Negative for rash.  Neurological: Negative for dizziness and headaches.  Endo/Heme/Allergies: Does not bruise/bleed easily.      Patient Active Problem List   Diagnosis Date Noted  . URI, acute 02/24/2017  . Anxiety and depression 11/18/2016  . Intrinsic atopic dermatitis 11/18/2016  . Knee pain, acute 02/13/2016  . OSA (obstructive sleep apnea) 10/10/2015  . Gastroesophageal reflux disease without esophagitis 10/08/2015  . Other seasonal allergic rhinitis 10/03/2015  . Allergic conjunctivitis 10/03/2015  . Eczema 09/16/2015  . Adjustment disorder with mixed anxiety and depressed mood 04/03/2015  . Morbid obesity due to excess calories (HCC)     No current outpatient medications on file prior to visit.   No current facility-administered medications on file prior to visit.     Allergies  Allergen Reactions  . Nickel     Physical Exam:    Vitals:   04/13/18 1332  BP: (!) 146/87  Pulse: 100  Weight: (!) 318 lb 3.2 oz (144.3 kg)  Height: 5' 7.72" (1.72 m)   Wt Readings from Last 3 Encounters:  04/13/18 (!) 318 lb 3.2 oz (144.3 kg)  02/28/18 (!) 322 lb 6.4 oz (146.2 kg)  11/29/17 (!) 316 lb 3.2  oz (143.4 kg)    Growth percentile SmartLinks can only be used for patients less than 23 years old. No LMP recorded.  Physical Exam Exam conducted with a chaperone present.  Constitutional:      General: She is not in acute distress.    Appearance: Normal appearance.  Neck:     Musculoskeletal: No neck rigidity.  Cardiovascular:     Rate and Rhythm: Regular rhythm.     Heart sounds: No murmur.  Genitourinary:    General: Normal vulva.     Vagina: Vaginal discharge (consistent with BV, fishy smell yellow ) present.     Rectum: Normal.     Comments: Non-friable cervix, ectropion noted, no CMT No adnexal tenderness or fullness but also difficult to assess with girth  Lymphadenopathy:     Cervical: No cervical adenopathy.  Skin:    General: Skin is warm.     Findings: No rash.  Neurological:     Mental Status: She is alert.  Psychiatric:        Mood and Affect: Mood is anxious.    Assessment/Plan: 1. Pap smear for cervical cancer screening -PAP collected, will call with results   - Cytology - PAP  2. Vaginal discharge -wet prep, will await results  3. Routine screening for STI (sexually transmitted infection)  - C. trachomatis/N. gonorrhoeae RNA - WET PREP BY MOLECULAR PROBE

## 2018-04-14 LAB — WET PREP BY MOLECULAR PROBE
Candida species: NOT DETECTED
MICRO NUMBER:: 91368733
SPECIMEN QUALITY: ADEQUATE
Trichomonas vaginosis: NOT DETECTED

## 2018-04-14 LAB — C. TRACHOMATIS/N. GONORRHOEAE RNA
C. trachomatis RNA, TMA: NOT DETECTED
N. gonorrhoeae RNA, TMA: NOT DETECTED

## 2018-04-15 ENCOUNTER — Other Ambulatory Visit: Payer: Self-pay | Admitting: Family

## 2018-04-15 MED ORDER — METRONIDAZOLE 500 MG PO TABS: 500.0000 mg | ORAL_TABLET | Freq: Two times a day (BID) | ORAL | 0 refills | Status: DC

## 2018-04-18 ENCOUNTER — Encounter: Payer: Self-pay | Admitting: Family

## 2018-04-18 LAB — CYTOLOGY - PAP
BACTERIAL VAGINITIS: POSITIVE — AB
CANDIDA VAGINITIS: NEGATIVE
Chlamydia: NEGATIVE
Diagnosis: NEGATIVE
Neisseria Gonorrhea: NEGATIVE
TRICH (WINDOWPATH): NEGATIVE

## 2018-04-19 LAB — CERVICOVAGINAL ANCILLARY ONLY: Herpes: NEGATIVE

## 2018-04-21 ENCOUNTER — Other Ambulatory Visit: Payer: Self-pay | Admitting: Family

## 2018-04-21 ENCOUNTER — Encounter: Payer: Self-pay | Admitting: Family

## 2018-04-21 MED ORDER — METRONIDAZOLE 500 MG PO TABS: 500.0000 mg | ORAL_TABLET | Freq: Two times a day (BID) | ORAL | 0 refills | Status: DC

## 2018-05-15 ENCOUNTER — Encounter: Payer: Self-pay | Admitting: Family

## 2018-09-08 ENCOUNTER — Ambulatory Visit (INDEPENDENT_AMBULATORY_CARE_PROVIDER_SITE_OTHER): Payer: Self-pay | Admitting: Family

## 2018-09-08 ENCOUNTER — Encounter: Payer: Self-pay | Admitting: Family

## 2018-09-08 ENCOUNTER — Telehealth: Payer: Self-pay | Admitting: Family

## 2018-09-08 ENCOUNTER — Other Ambulatory Visit: Payer: Self-pay

## 2018-09-08 DIAGNOSIS — B852 Pediculosis, unspecified: Secondary | ICD-10-CM

## 2018-09-08 DIAGNOSIS — B850 Pediculosis due to Pediculus humanus capitis: Secondary | ICD-10-CM

## 2018-09-08 DIAGNOSIS — F4323 Adjustment disorder with mixed anxiety and depressed mood: Secondary | ICD-10-CM

## 2018-09-08 MED ORDER — IVERMECTIN 0.5 % EX LOTN: TOPICAL_LOTION | CUTANEOUS | 1 refills | Status: DC

## 2018-09-08 NOTE — Telephone Encounter (Signed)
Medication coupon emailed to patient per request of Bernell List, FNP.

## 2018-09-08 NOTE — Progress Notes (Signed)
Virtual Visit via Video Note  I connected with Carrie Allen  on 09/08/18 at 10:00 AM EDT by a video enabled telemedicine application and verified that I am speaking with the correct person using two identifiers.   Location of patient/parent: home    I discussed the limitations of evaluation and management by telemedicine and the availability of in person appointments.  I discussed that the purpose of this phone visit is to provide medical care while limiting exposure to the novel coronavirus.  The patient expressed understanding and agreed to proceed.  Reason for visit:  -lice nits   History of Present Illness:   -coconut oil and tea tree oil -white balls in hair coming out easier than nits were per mom -[employer] treatment about 2 months ago; noticed live plus nits -about a week or 2 ago noticed live in her hair; did another treatment and thoroughly combed through her hair  -yesterday her grandmother - mainly nits in the frontline of hair  -so grandmother did white vinegar/tea tree/coconut oil  -otherwise she is well, not concerned with anxiety/depression right now.  -safe at home and to herself  Observations/Objective: hair cut to shoulder-length; red dye; pulled away from face and off shoulders for call   Assessment and Plan:  -per pharmacy a year long backorder on [place] -try Nix for now for money savings -return precautions given  -advised to wash linens and towels; remove all nits from hair   Follow Up Instructions:  -return as needed    I discussed the assessment and treatment plan with the patient and/or parent/guardian. They were provided an opportunity to ask questions and all were answered. They agreed with the plan and demonstrated an understanding of the instructions.   They were advised to call back or seek an in-person evaluation in the emergency room if the symptoms worsen or if the condition fails to improve as anticipated.  I provided 16 minutes of  non-face-to-face time during this encounter. I was located off-site during this encounter.  Georges Mouse, NP

## 2018-09-09 ENCOUNTER — Encounter (INDEPENDENT_AMBULATORY_CARE_PROVIDER_SITE_OTHER): Payer: Self-pay | Admitting: Family

## 2018-09-09 DIAGNOSIS — Z09 Encounter for follow-up examination after completed treatment for conditions other than malignant neoplasm: Secondary | ICD-10-CM

## 2018-09-09 NOTE — Telephone Encounter (Signed)
Virtual Visit via Telephone Note  I connected with Carrie Allen  today by telephone and verified that I am speaking with the correct person using two identifiers. Location of patient/parent: home   I discussed the limitations, risks, security and privacy concerns of performing an evaluation and management service by telephone and the availability of in person appointments. I discussed that the purpose of this phone visit is to provide medical care while limiting exposure to the novel coronavirus.  I also discussed with the patient that there may be a patient responsible charge related to this service. The patient expressed understanding and agreed to proceed.  Reason for visit:   Update on lice/nits  History of Present Illness:  -[name] sent My Chart message asking for a return phone call.  -she completed Rid OTC tx yesterday - Nix was too expensive -she has cleaned her linens -she asks if she can come in for nit removal, does not have anyone to help her at her house -no live louse noted, only nits and not close to scalp; currently has tea tree on her hair   Assessment and Plan:  -advised her that we do not remove nits in office and in the setting of COVID-19, lice removal facility in [place] may not be open -suggested she have family member or friend not quarantined to assist her with nit combing -advised to dispose of nits in rubbing alcohol; comb through hair carefully in sections, paying special attention to warmest areas of scalp around crown and earline  Follow Up Instructions:  -[name] will call or send My Chart message next week to see how she is doing    I discussed the assessment and treatment plan with the patient and/or parent/guardian. They were provided an opportunity to ask questions and all were answered. They agreed with the plan and demonstrated an understanding of the instructions.   They were advised to call back or seek an in-person evaluation in the  emergency room if the symptoms worsen or if the condition fails to improve as anticipated.  I provided 11:42 minutes of non-face-to-face time during this encounter. I was located off-site during this encounter.  Georges Mouse, NP

## 2018-09-16 ENCOUNTER — Encounter: Payer: Self-pay | Admitting: Family

## 2018-10-13 ENCOUNTER — Encounter: Payer: Self-pay | Admitting: Family

## 2018-11-26 ENCOUNTER — Encounter: Payer: Self-pay | Admitting: Family

## 2018-12-14 ENCOUNTER — Ambulatory Visit: Payer: Medicaid Other | Admitting: Family

## 2018-12-15 ENCOUNTER — Other Ambulatory Visit: Payer: Self-pay

## 2018-12-15 ENCOUNTER — Emergency Department (HOSPITAL_COMMUNITY)
Admission: EM | Admit: 2018-12-15 | Discharge: 2018-12-15 | Disposition: A | Discharge status: Home / Self Care | Payer: Self-pay | Attending: Emergency Medicine | Admitting: Emergency Medicine

## 2018-12-15 DIAGNOSIS — F17210 Nicotine dependence, cigarettes, uncomplicated: Secondary | ICD-10-CM | POA: Insufficient documentation

## 2018-12-15 DIAGNOSIS — Z79899 Other long term (current) drug therapy: Secondary | ICD-10-CM | POA: Insufficient documentation

## 2018-12-15 DIAGNOSIS — B850 Pediculosis due to Pediculus humanus capitis: Secondary | ICD-10-CM | POA: Insufficient documentation

## 2018-12-15 MED ORDER — PERMETHRIN 5 % EX CREA: TOPICAL_CREAM | CUTANEOUS | 0 refills | Status: DC

## 2018-12-15 NOTE — ED Provider Notes (Signed)
[facility] EMERGENCY DEPARTMENT Provider Note   CSN: [id number] Arrival date & time: 12/15/18  2122   History   Chief Complaint Chief Complaint  Patient presents with  . Head Lice    HPI Carrie Allen is a 23 y.o. female with past medical history significant for obesity who presents for evaluation of head lice.  Patient states she has had a pruritic scalp as well as white "bugs".  Patient states she was around someone who was diagnosed with lice.  She has tried OTC shampoo however this has not resolved her issue.  Patient states she was very seen for this issue outpatient and prescribed ivermectin however she cannot afford this and did not get this filled.  Denies fever, chills, nausea, vomiting, chest pain, shortness of breath, rashes, lesions, redness, swelling, warmth.  Symptoms persistent since onset.  History obtained from patient and past medical records.  No interpreter was used.     HPI  Past Medical History:  Diagnosis Date  . Obesity   . Sleep apnea     Patient Active Problem List   Diagnosis Date Noted  . URI, acute 02/24/2017  . Anxiety and depression 11/18/2016  . Intrinsic atopic dermatitis 11/18/2016  . Knee pain, acute 02/13/2016  . OSA (obstructive sleep apnea) 10/10/2015  . Gastroesophageal reflux disease without esophagitis 10/08/2015  . Other seasonal allergic rhinitis 10/03/2015  . Allergic conjunctivitis 10/03/2015  . Eczema 09/16/2015  . Adjustment disorder with mixed anxiety and depressed mood 04/03/2015  . Morbid obesity due to excess calories ([place])     No past surgical history on file.   OB History   No obstetric history on file.      Home Medications    Prior to Admission medications   Medication Sig Start Date End Date Taking? Authorizing Provider  Ivermectin (SKLICE) 0.5 % LOTN Apply on affected area per the instructions on tube. 09/08/18   Parthenia Ames, NP  permethrin (ELIMITE) 5 % cream Apply to affected  area once 12/15/18   Jordon Bourquin A, PA-C    Family History Family History  Problem Relation Age of Onset  . Autism Brother     Social History Social History   Tobacco Use  . Smoking status: Current Some Day Smoker    Packs/day: 0.25  . Smokeless tobacco: Never Used  . Tobacco comment: she smokes irreg and GF smokes outdoors  Substance Use Topics  . Alcohol use: Yes    Alcohol/week: 1.0 standard drinks    Types: 1 Shots of liquor per week    Comment: socially  . Drug use: Yes    Types: Marijuana, Cocaine    Comment: last year- cocaine  marijuana- a couple of days ago     Allergies   Nickel   Review of Systems Review of Systems  Constitutional: Negative.   HENT: Negative.   Respiratory: Negative.   Cardiovascular: Negative.   Gastrointestinal: Negative.   Genitourinary: Negative.   Musculoskeletal: Negative.   Skin: Negative for rash and wound.       Lice to scalp  Neurological: Negative.   All other systems reviewed and are negative.   Physical Exam Updated Vital Signs BP (!) 157/89 (BP Location: Right Arm)   Pulse 97   Temp 99.4 F (37.4 C) (Oral)   Resp 14   Physical Exam Vitals signs and nursing note reviewed.  Constitutional:      General: She is not in acute distress.    Appearance:  She is well-developed. She is not ill-appearing, toxic-appearing or diaphoretic.  HENT:     Head: Normocephalic and atraumatic.     Nose: Nose normal.     Mouth/Throat:     Mouth: Mucous membranes are moist.     Pharynx: Oropharynx is clear.  Eyes:     Pupils: Pupils are equal, round, and reactive to light.  Neck:     Musculoskeletal: Normal range of motion.  Cardiovascular:     Rate and Rhythm: Normal rate.  Pulmonary:     Effort: No respiratory distress.  Abdominal:     General: There is no distension.  Musculoskeletal: Normal range of motion.  Skin:    General: Skin is warm and dry.     Comments: Multiple white nits approximately 1 inch from scalp.   No overlying skin changes to scalp.  No erythema, rash, swelling.  Neurological:     Mental Status: She is alert.      ED Treatments / Results  Labs (all labs ordered are listed, but only abnormal results are displayed) Labs Reviewed - No data to display  EKG None  Radiology No results found.  Procedures Procedures (including critical care time)  Medications Ordered in ED Medications - No data to display   Initial Impression / Assessment and Plan / ED Course  I have reviewed the triage vital signs and the nursing notes.  Pertinent labs & imaging results that were available during my care of the patient were reviewed by me and considered in my medical decision making (see chart for details).  23 year old female with head lice.  Has tried OTC Nix 2 weeks ago without relief of symptoms.  Patient seen by PCP scribed ivermectin however was unable to pick this up as it was too expensive.  Has had lice intermittently since February.  No evidence of skin lice, scabies.  Exam consistent with multiple white nits approximately 2 inches from scalp. Patient denies any difficulty breathing or swallowing.  Pt has a patent airway without stridor and is handling secretions without difficulty; no angioedema. No blisters, no pustules, no warmth, no draining sinus tracts, no superficial abscesses, no bullous impetigo, no vesicles, no desquamation, no target lesions with dusky purpura or a central bulla. Not tender to touch. No concern for superimposed infection. No concern for SJS, TEN, TSS, tick borne illness, syphilis or other life-threatening condition. Will DC home permethrin cream and have her follow up with PCP.  Discussed thorough cleaning of her clothes and household items. Discussed Return precautions.  Patient voiced understanding agreeable to follow-up.   e    Final Clinical Impressions(s) / ED Diagnoses   Final diagnoses:  Head lice    ED Discharge Orders         Ordered     permethrin (ELIMITE) 5 % cream     12/15/18 2152           Demeco Ducksworth A, PA-C 12/15/18 2346    Azalia Bilisampos, Kevin, MD 12/16/18 0023

## 2018-12-15 NOTE — ED Triage Notes (Signed)
Pt has head lice and needs a prescription.

## 2018-12-15 NOTE — Discharge Instructions (Addendum)
Follow up with PCP for reevaluation if you continue to have symptoms.

## 2018-12-16 ENCOUNTER — Encounter: Payer: Self-pay | Admitting: Family

## 2019-05-08 ENCOUNTER — Encounter: Payer: Self-pay | Admitting: Family

## 2019-05-10 ENCOUNTER — Telehealth: Payer: Self-pay

## 2019-05-10 NOTE — Telephone Encounter (Signed)
My Chart message sent

## 2019-05-10 NOTE — Telephone Encounter (Signed)
PHQ-SADS Last 3 Score only 05/10/2019 02/24/2017 11/18/2016  PHQ-15 Score 14 - -  Total GAD-7 Score 20 18 11   Score 20 19 21     Routing to Hoyt Koch, NP for upcoming appointment on 12/28 for a virtual visit.

## 2019-05-11 ENCOUNTER — Other Ambulatory Visit: Payer: Self-pay

## 2019-05-11 ENCOUNTER — Ambulatory Visit: Payer: Medicaid Other | Admitting: Pediatrics

## 2019-05-11 DIAGNOSIS — Z975 Presence of (intrauterine) contraceptive device: Secondary | ICD-10-CM | POA: Insufficient documentation

## 2019-05-15 ENCOUNTER — Encounter: Payer: Self-pay | Admitting: Family

## 2019-05-16 ENCOUNTER — Ambulatory Visit (INDEPENDENT_AMBULATORY_CARE_PROVIDER_SITE_OTHER): Payer: Medicaid Other | Admitting: Pediatrics

## 2019-05-16 DIAGNOSIS — F419 Anxiety disorder, unspecified: Secondary | ICD-10-CM | POA: Diagnosis not present

## 2019-05-16 DIAGNOSIS — F329 Major depressive disorder, single episode, unspecified: Secondary | ICD-10-CM

## 2019-05-16 DIAGNOSIS — Z975 Presence of (intrauterine) contraceptive device: Secondary | ICD-10-CM

## 2019-05-16 DIAGNOSIS — F5104 Psychophysiologic insomnia: Secondary | ICD-10-CM

## 2019-05-16 DIAGNOSIS — F32A Depression, unspecified: Secondary | ICD-10-CM

## 2019-05-16 MED ORDER — ESCITALOPRAM OXALATE 10 MG PO TABS: 10.0000 mg | ORAL_TABLET | Freq: Every day | ORAL | 2 refills | Status: DC

## 2019-05-16 MED ORDER — HYDROXYZINE HCL 25 MG PO TABS: 25.0000 mg | ORAL_TABLET | Freq: Every evening | ORAL | 0 refills | Status: DC | PRN

## 2019-05-16 NOTE — Progress Notes (Signed)
THIS RECORD MAY CONTAIN CONFIDENTIAL INFORMATION THAT SHOULD NOT BE RELEASED WITHOUT REVIEW OF THE SERVICE PROVIDER.  Virtual Follow-Up Visit via Video Note  I connected with Carrie Allen 's patient  on 05/16/19 at 11:00 AM EST by a video enabled telemedicine application and verified that I am speaking with the correct person using two identifiers.    This patient visit was completed through the use of an audio/video or telephone encounter in the setting of the State of Emergency due to the COVID-19 Pandemic.  I discussed that the purpose of this telehealth visit is to provide medical care while limiting exposure to the novel coronavirus.       I discussed the limitations of evaluation and management by telemedicine and the availability of in person appointments.    The patient expressed understanding and agreed to proceed.   The patient was physically located at home in [place] or a state in which I am permitted to provide care. The patient and/or parent/guardian understood that s/he may incur co-pays and cost sharing, and agreed to the telemedicine visit. The visit was reasonable and appropriate under the circumstances given the patient's presentation at the time.   The patient and/or parent/guardian has been advised of the potential risks and limitations of this mode of treatment (including, but not limited to, the absence of in-person examination) and has agreed to be treated using telemedicine. The patient's/patient's family's questions regarding telemedicine have been answered.    As this visit was completed in an ambulatory virtual setting, the patient and/or parent/guardian has also been advised to contact their provider's office for worsening conditions, and seek emergency medical treatment and/or call 911 if the patient deems either necessary.   Team Care Documentation:  Team care documentation used during this visit? no Team care members present and location: No   Carrie Allen is a 23 y.o. female referred by Tresa Garter, MD here today for follow-up of anxiety, depression, insomnia, and nexplanon.   Growth Chart Viewed? not applicable  Previsit planning completed:  yes   History was provided by the patient.  PCP Confirmed?  yes  My Chart Activated?   no    Plan from Last Visit:   Continue nexplanon   Chief Complaint: Anxiety and depression   History of Present Illness:  Reports recently she has been ok, but has really bad anxiety. The slightest change in anything causes chest pain, million thoughts running through her head. This has been ongoing for years, but this year it is 10x worse. Not working- anxiety has interfered with her getting and keeping job.   Lost best friend in July, and ever since then every day is hard. ? Still grieving, but thinking about him every day.   Hard to focus on thins when this is all she can think about. Lost two more people as well, one just three days ago.   Doesn't live with grandparents anymore, but come over every day. Aunt and cousins are currently living there d/t aunt's divorce so there isn't anywhere for her to stay.   Tried to take medication, but only remembered for about 1 week. Was seeing therapist but only went twice. lexapro was what worked.   Schedule is all over the place. Living with friend. She is there at friends house by herself 7p-7a but has really had anxiety being there by herself.   Has nexplanon. No issues with it. Not sexually active since July 4th.   Has had thoughts  of self harm, still trying to figure out triggers.   PHQ-SADS Last 3 Score only 05/10/2019 02/24/2017 11/18/2016  PHQ-15 Score 14 - -  Total GAD-7 Score 20 18 11   Score 20 19 21      No LMP recorded.  Review of Systems  Constitutional: Positive for malaise/fatigue.  Eyes: Negative for double vision.  Respiratory: Negative for shortness of breath.   Cardiovascular: Positive for chest pain. Negative for  palpitations.  Gastrointestinal: Negative for abdominal pain, constipation, diarrhea, nausea and vomiting.  Genitourinary: Negative for dysuria.  Musculoskeletal: Negative for joint pain and myalgias.  Skin: Negative for rash.  Neurological: Negative for dizziness and headaches.  Endo/Heme/Allergies: Does not bruise/bleed easily.  Psychiatric/Behavioral: Positive for depression. Negative for suicidal ideas. The patient is nervous/anxious and has insomnia.      Allergies  Allergen Reactions  . Nickel    Outpatient Medications Prior to Visit  Medication Sig Dispense Refill  . Ivermectin (SKLICE) 0.5 % LOTN Apply on affected area per the instructions on tube. 1 Tube 1  . permethrin (ELIMITE) 5 % cream Apply to affected area once 60 g 0   No facility-administered medications prior to visit.     Patient Active Problem List   Diagnosis Date Noted  . Nexplanon in place 05/11/2019  . URI, acute 02/24/2017  . Anxiety and depression 11/18/2016  . Intrinsic atopic dermatitis 11/18/2016  . Knee pain, acute 02/13/2016  . OSA (obstructive sleep apnea) 10/10/2015  . Gastroesophageal reflux disease without esophagitis 10/08/2015  . Other seasonal allergic rhinitis 10/03/2015  . Allergic conjunctivitis 10/03/2015  . Eczema 09/16/2015  . Adjustment disorder with mixed anxiety and depressed mood 04/03/2015  . Morbid obesity due to excess calories [facility](HCC)     Past Medical History:  Reviewed and updated?  yes Past Medical History:  Diagnosis Date  . Obesity   . Sleep apnea     Family History: Reviewed and updated? yes Family History  Problem Relation Age of Onset  . Autism Brother     Confidentiality was discussed with the patient and if applicable, with caregiver as well.  Enter confidential phone number in Family Comments section of [employer] Tobacco?  no Drugs/ETOH?  yes, ETOH on the weekends; was smoking MJ every day when living in [place], still here Partner preference?  female  Sexually Active?  no  Pregnancy Prevention:  implant, reviewed condoms & plan B Trauma currently or in the pastt?  no Suicidal or Self-Harm thoughts?   yes Guns in the home?  no  The following portions of the patient's history were reviewed and updated as appropriate: allergies, current medications, past family history, past medical history, past social history, past surgical history and problem list.  Visual Observations/Objective:   General Appearance: Well nourished well developed, in no apparent distress.  Eyes: conjunctiva no swelling or erythema ENT/Mouth: No hoarseness, No cough for duration of visit.  Neck: Supple  Respiratory: Respiratory effort normal, normal rate, no retractions or distress.   Cardio: Appears well-perfused, noncyanotic Musculoskeletal: no obvious deformity Skin: visible skin without rashes, ecchymosis, erythema Neuro: Awake and oriented X 3,  Psych:  normal affect, Insight and Judgment appropriate.    Assessment/Plan: 1. Nexplanon in place nexplanon in place with no concerns today.   2. Anxiety and depression Will [facility] to [facility] given that she only has family planning medicaid. Will restart lexapro and monitor closely.  - [facility] - escitalopram (LEXAPRO) 10 MG tablet; Take 1 tablet (10 mg  total) by mouth daily.  Dispense: 30 tablet; Refill: 2  3. Psychophysiological insomnia Will try hydroxyzine to improve sleep quality.  - hydrOXYzine (ATARAX/VISTARIL) 25 MG tablet; Take 1 tablet (25 mg total) by mouth at bedtime as needed.  Dispense: 30 tablet; Refill: 0    I discussed the assessment and treatment plan with the patient and/or parent/guardian.  They were provided an opportunity to ask questions and all were answered.  They agreed with the plan and demonstrated an understanding of the instructions. They were advised to call back or seek an in-person evaluation in the emergency room if the symptoms worsen  or if the condition fails to improve as anticipated.   Follow-up:  2 weeks   Medical decision-making:   I spent 25 minutes on this telehealth visit inclusive of face-to-face video and care coordination time I was located off site during this encounter.   Alfonso Ramus, FNP    CC: Quentin Angst, MD, Quentin Angst, MD

## 2019-05-29 ENCOUNTER — Ambulatory Visit: Payer: Medicaid Other | Admitting: Family

## 2019-05-31 ENCOUNTER — Ambulatory Visit: Payer: Medicaid Other | Admitting: Pediatrics

## 2019-06-17 ENCOUNTER — Encounter: Payer: Self-pay | Admitting: Family

## 2019-06-29 ENCOUNTER — Encounter: Payer: Self-pay | Admitting: Family

## 2019-07-06 ENCOUNTER — Ambulatory Visit: Payer: Medicaid Other | Admitting: Family

## 2019-07-19 ENCOUNTER — Encounter: Payer: Self-pay | Admitting: Family

## 2019-07-19 ENCOUNTER — Telehealth: Payer: Self-pay | Admitting: Pediatrics

## 2019-07-19 DIAGNOSIS — F410 Panic disorder [episodic paroxysmal anxiety] without agoraphobia: Secondary | ICD-10-CM

## 2019-07-19 DIAGNOSIS — F4323 Adjustment disorder with mixed anxiety and depressed mood: Secondary | ICD-10-CM

## 2019-07-19 DIAGNOSIS — G479 Sleep disorder, unspecified: Secondary | ICD-10-CM

## 2019-07-19 NOTE — Progress Notes (Signed)
This note is not being shared with the patient for the following reason: To prevent harm (release of this note would result in harm to the life or physical safety of the patient or another).  THIS RECORD MAY CONTAIN CONFIDENTIAL INFORMATION THAT SHOULD NOT BE RELEASED WITHOUT REVIEW OF THE SERVICE PROVIDER.  Virtual Follow-Up Visit via Video Note  I connected with Carrie Allen  on 07/19/19 at  3:30 PM EST by a video enabled telemedicine application and verified that I am speaking with the correct person using two identifiers.   Patient/parent location: Home   I discussed the limitations of evaluation and management by telemedicine and the availability of in person appointments.  I discussed that the purpose of this telehealth visit is to provide medical care while limiting exposure to the novel coronavirus.  The patient expressed understanding and agreed to proceed.   Carrie Allen is a 24 y.o. female referred by Tresa Garter, MD here today for follow-up of anxiety, panic attacks.   History was provided by the patient.  Plan from Last Visit:   Currently on Lexapro 10 mg daily and Atarax 25 mg PRN sleep, anxiety   Chief Complaint: Panic attack  History of Present Illness:  Patient reports that at 1:28 PM could feel tears filling up in eyes, shaking of body Went to lunch, started bawling, thoughts racing Lasted for over an hour, now doing okay Reports being upset that she cannot pinpoint a trigger for today's episode.   Difficulty remembering to take medication. Has not consistently taken medication in the past month.   Not in therapy right now. There was a wait list for outpatient therapy and she was unable to get an appointment. She is able to talk with her roommate but overall feels that it is difficult to share her thoughts with others because it is overwhelming. Afraid that one day it will get to a place where she will want to harm herself. No SI/HI or self-harm  recently.   Poor sleep hygiene. Feels groggy after sleeping for a very long time Schedule 9-5:30 PM Tries to be in bed by 10P-12A   ROS:   All other systems reviewed and negative except as stated in the HPI.   Allergies  Allergen Reactions  . Nickel    Outpatient Medications Prior to Visit  Medication Sig Dispense Refill  . escitalopram (LEXAPRO) 10 MG tablet Take 1 tablet (10 mg total) by mouth daily. 30 tablet 2  . hydrOXYzine (ATARAX/VISTARIL) 25 MG tablet Take 1 tablet (25 mg total) by mouth at bedtime as needed. 30 tablet 0   No facility-administered medications prior to visit.     Patient Active Problem List   Diagnosis Date Noted  . Nexplanon in place 05/11/2019  . URI, acute 02/24/2017  . Anxiety and depression 11/18/2016  . Intrinsic atopic dermatitis 11/18/2016  . Knee pain, acute 02/13/2016  . OSA (obstructive sleep apnea) 10/10/2015  . Gastroesophageal reflux disease without esophagitis 10/08/2015  . Other seasonal allergic rhinitis 10/03/2015  . Allergic conjunctivitis 10/03/2015  . Eczema 09/16/2015  . Adjustment disorder with mixed anxiety and depressed mood 04/03/2015  . Morbid obesity due to excess calories ([place])     Visual Observations/Objective:  General Appearance: Well nourished well developed, in no apparent distress.  Eyes: conjunctiva nl, dark circles under eyes ENT/Mouth: No hoarseness, No cough for duration of visit.  Respiratory: Respiratory effort normal Cardio: Appears well-perfused, noncyanotic Musculoskeletal: no obvious deformity Skin: visible skin without rashes,  ecchymosis, erythema Neuro: Awake and oriented X 3,  Psych:  anxious mood, normal insight    Assessment/Plan: 1. Adjustment disorder with mixed anxiety and depressed mood Discussed the importance of continuing Lexapro 10 mg daily, plan to set alarm on phone Discussed using hydroxyzine for breakthrough anxiety, consider 10 mg dosing so that patient may use multiple times  per day if needed Discussed sleep hygiene and daily routine at length with plan to create a normal sleeping schedule over next several weeks Discussed 5-4-3-2-1 grounding technique for panic attacks and talked her through this technique during the visit; provided handout on AVS   I discussed the assessment and treatment plan with the patient and/or parent/guardian.  They were provided an opportunity to ask questions and all were answered.  They agreed with the plan and demonstrated an understanding of the instructions. They were advised to call back or seek an in-person evaluation in the emergency room if the symptoms worsen or if the condition fails to improve as anticipated.   Follow-up:   Joint appt with [facility] in 2 weeks  Medical decision-making:   I spent 45 minutes on this telehealth visit inclusive of face-to-face video and care coordination time I was located remotely during this encounter.   Alexander Mt, MD    CC: Quentin Angst, MD, Quentin Angst, MD

## 2019-07-19 NOTE — Patient Instructions (Signed)
Anxiety is something most of [place] have experienced at least once in our life. Public speaking, performance reviews, and new job responsibilities are just some of the work-related situations that can cause even the calmest person to feel a little stressed. This five-step exercise can be very helpful during periods of anxiety or panic by helping to ground you in the present when your mind is bouncing around between various anxious thoughts.  Before starting this exercise, pay attention to your breathing. Slow, deep, long breaths can help you maintain a sense of calm or help you return to a calmer state. Once you find your breath, go through the following steps to help ground yourself:   5: Acknowledge FIVE things you see around you. It could be a pen, a spot on the ceiling, anything in your surroundings.  4: Acknowledge FOUR things you can touch around you. It could be your hair, a pillow, or the ground under your feet.   3: Acknowledge THREE things you hear. This could be any external sound. If you can hear your belly rumbling that counts! Focus on things you can hear outside of your body.  2: Acknowledge TWO things you can smell. Maybe you are in your office and smell pencil, or maybe you are in your bedroom and smell a pillow. If you need to take a brief walk to find a scent you could smell soap in your bathroom, or nature outside.  1: Acknowledge ONE thing you can taste. What does the inside of your mouth taste like-gum, coffee, or the sandwich from lunch?  Adults need slightly less (7-9 hours each night).  11 Tips to Follow:  1. No caffeine after 3pm: Avoid beverages with caffeine (soda, tea, energy drinks, etc.) especially after 3pm. 2. Don't go to bed hungry: Have your evening meal at least 3 hrs. before going to sleep. It's fine to have a small bedtime snack such as a glass of milk and a few crackers but don't have a big meal. 3. Have a nightly routine before bed: Plan on "winding down" before  you go to sleep. Begin relaxing about 1 hour before you go to bed. Try doing a quiet activity such as listening to calming music, reading a book or meditating. 4. Turn off the TV and ALL electronics including video games, tablets, laptops, etc. 1 hour before sleep, and keep them out of the bedroom. 5. Turn off your cell phone and all notifications (new email and text alerts) or even better, leave your phone outside your room while you sleep. Studies have shown that a part of your brain continues to respond to certain lights and sounds even while you're still asleep. 6. Make your bedroom quiet, dark and cool. If you can't control the noise, try wearing earplugs or using a fan to block out other sounds. 7. Practice relaxation techniques. Try reading a book or meditating or drain your brain by writing a list of what you need to do the next day. 8. Don't nap unless you feel sick: you'll have a better night's sleep. 9. Don't smoke, or quit if you do. Nicotine, alcohol, and marijuana can all keep you awake. Talk to your health care provider if you need help with substance use. 10. Most importantly, wake up at the same time every day (or within 1 hour of your usual wake up time) EVEN on the weekends. A regular wake up time promotes sleep hygiene and prevents sleep problems. 11. Reduce exposure to bright light in the  last three hours of the day before going to sleep. Maintaining good sleep hygiene and having good sleep habits lower your risk of developing sleep problems. Getting better sleep can also improve your concentration and alertness. Try the simple steps in this guide. If you still have trouble getting enough rest, make an appointment with your health care provider.

## 2019-07-21 NOTE — Progress Notes (Signed)
I have reviewed the resident's note and plan of care and helped develop the plan as necessary.  Lagena Strand, FNP   

## 2019-07-24 ENCOUNTER — Encounter: Payer: Self-pay | Admitting: Family

## 2019-07-28 ENCOUNTER — Other Ambulatory Visit: Payer: Medicaid Other

## 2019-07-28 ENCOUNTER — Other Ambulatory Visit: Payer: Self-pay | Admitting: Family

## 2019-07-28 MED ORDER — METRONIDAZOLE 500 MG PO TABS: 500.0000 mg | ORAL_TABLET | Freq: Two times a day (BID) | ORAL | 0 refills | Status: AC

## 2019-08-02 ENCOUNTER — Telehealth: Payer: Medicaid Other | Admitting: Family

## 2019-08-02 ENCOUNTER — Ambulatory Visit: Payer: Medicaid Other | Admitting: Licensed Clinical Social Worker

## 2019-08-02 NOTE — BH Specialist Note (Signed)
Integrated Behavioral Health via Telemedicine Video Visit  08/02/2019 Carrie Allen [id number]  Patient no show appointment, chart closed for administrative reasons.

## 2019-08-06 ENCOUNTER — Encounter: Payer: Self-pay | Admitting: Family

## 2019-08-07 ENCOUNTER — Encounter: Payer: Self-pay | Admitting: Family

## 2019-08-10 ENCOUNTER — Ambulatory Visit: Payer: Medicaid Other | Admitting: Licensed Clinical Social Worker

## 2019-08-10 ENCOUNTER — Telehealth: Payer: Medicaid Other | Admitting: Pediatrics

## 2019-08-10 ENCOUNTER — Ambulatory Visit: Payer: Medicaid Other | Admitting: Family

## 2019-08-10 NOTE — Progress Notes (Signed)
Patient NS. Closed for admin purposes.

## 2019-08-25 ENCOUNTER — Encounter: Payer: Self-pay | Admitting: Family

## 2019-11-23 ENCOUNTER — Encounter: Payer: Self-pay | Admitting: Family

## 2019-11-27 ENCOUNTER — Ambulatory Visit: Payer: Self-pay | Admitting: Family

## 2019-11-30 ENCOUNTER — Ambulatory Visit: Payer: Medicaid Other | Admitting: Family

## 2019-12-19 ENCOUNTER — Ambulatory Visit: Payer: Self-pay | Attending: Internal Medicine | Admitting: Internal Medicine

## 2019-12-19 ENCOUNTER — Other Ambulatory Visit: Payer: Self-pay

## 2020-04-19 ENCOUNTER — Other Ambulatory Visit: Payer: Self-pay

## 2020-04-19 ENCOUNTER — Ambulatory Visit: Payer: Medicaid Other | Attending: Internal Medicine

## 2020-04-30 ENCOUNTER — Telehealth: Payer: Self-pay | Admitting: Internal Medicine

## 2020-04-30 NOTE — Telephone Encounter (Signed)
Pt was sent a letter from financial dept. Inform them, that the application they submitted was incomplete, since they were missing some documentation at the time of the appointment, Pt need to reschedule and resubmit all new papers and application for [employer] and [employer], P.S. old documents has been sent back by mail to the Pt and Pt. need to make a new appt. 

## 2020-05-15 ENCOUNTER — Other Ambulatory Visit: Payer: Self-pay

## 2020-05-15 ENCOUNTER — Ambulatory Visit: Payer: Medicaid Other | Attending: Internal Medicine

## 2020-05-27 DIAGNOSIS — Z3046 Encounter for surveillance of implantable subdermal contraceptive: Secondary | ICD-10-CM | POA: Diagnosis not present

## 2020-05-29 ENCOUNTER — Encounter: Payer: Medicaid Other | Admitting: Licensed Clinical Social Worker

## 2020-06-07 ENCOUNTER — Telehealth: Payer: Self-pay | Admitting: Internal Medicine

## 2020-06-07 NOTE — Telephone Encounter (Signed)
Called pt to change appt scheduled for Monday 06/10/20. *Pt needs a New Patient appointment due to not been seen in 6 months. LVM to return call. Thank you

## 2020-06-09 DIAGNOSIS — Z1152 Encounter for screening for COVID-19: Secondary | ICD-10-CM | POA: Diagnosis not present

## 2020-06-10 ENCOUNTER — Ambulatory Visit: Payer: Medicaid Other | Admitting: Internal Medicine

## 2020-06-13 DIAGNOSIS — Z1152 Encounter for screening for COVID-19: Secondary | ICD-10-CM | POA: Diagnosis not present

## 2020-06-20 ENCOUNTER — Encounter: Payer: Self-pay | Admitting: Internal Medicine

## 2020-06-21 ENCOUNTER — Encounter: Payer: Self-pay | Admitting: Internal Medicine

## 2020-06-21 ENCOUNTER — Other Ambulatory Visit: Payer: Self-pay

## 2020-06-21 ENCOUNTER — Other Ambulatory Visit: Payer: Self-pay | Admitting: Internal Medicine

## 2020-06-21 ENCOUNTER — Ambulatory Visit: Payer: Self-pay | Attending: Internal Medicine | Admitting: Internal Medicine

## 2020-06-21 VITALS — BP 132/81 | HR 74 | Temp 99.5°F | Resp 16 | Ht 67.0 in | Wt 321.8 lb

## 2020-06-21 DIAGNOSIS — U071 COVID-19: Secondary | ICD-10-CM

## 2020-06-21 DIAGNOSIS — F411 Generalized anxiety disorder: Secondary | ICD-10-CM

## 2020-06-21 DIAGNOSIS — R030 Elevated blood-pressure reading, without diagnosis of hypertension: Secondary | ICD-10-CM

## 2020-06-21 DIAGNOSIS — F322 Major depressive disorder, single episode, severe without psychotic features: Secondary | ICD-10-CM

## 2020-06-21 MED ORDER — HYDROXYZINE HCL 25 MG PO TABS: 25.0000 mg | ORAL_TABLET | Freq: Two times a day (BID) | ORAL | 1 refills | Status: DC | PRN

## 2020-06-21 MED ORDER — BENZONATATE 100 MG PO CAPS: 100.0000 mg | ORAL_CAPSULE | Freq: Two times a day (BID) | ORAL | 0 refills | Status: DC | PRN

## 2020-06-21 MED ORDER — ESCITALOPRAM OXALATE 10 MG PO TABS: ORAL_TABLET | ORAL | 2 refills | Status: DC

## 2020-06-21 MED FILL — BENZONATATE 100 MG CAPS: 100 | 10 days supply | Qty: 20 | Fill #0

## 2020-06-21 MED FILL — ESCITALOPRAM 10 MG TABLET: 10 | 14 days supply | Qty: 7 | Fill #0

## 2020-06-21 MED FILL — hydrOXYzine HCL 25 MG TABS: 25 | 15 days supply | Qty: 30 | Fill #0

## 2020-06-21 NOTE — Progress Notes (Signed)
Patient ID: Carrie Allen, female    DOB: 1996/03/06  MRN: [id number]  CC: Covid Positive and New Patient (Initial Visit)   Subjective: Carrie Allen is a 25 y.o. female who presents for new pt visit Her concerns today include:  Pt with GERD, obesity, anx/dep, OSA  Used to be seen here by Dr. Hyman Hopes.  She presents to reestablish care.  Reports being diagnosed with COVID on 06/11/2020.  Symptoms started 2 days prior.  She went on retested on the 15th and test was still positive.  She has received 2 doses of Pfizer vaccine and is due for the booster. -Reports that she is better but cough persists.  Cough is dry but she is not having chest pains with coughing as she did initially.  Little shortness of breath when she coughs.  Highest temperature she has had was 104 and that was last week.  She had been using Sudafed and DayQuil but have not used them in a few days.  She has not returned to work as yet.  Other main complaint today is depression and anxiety which she states she has had for years.  At the point where she feels she needs to do something about it.  Reports feeling suicidal 2 to 3 weeks ago but not at this time.  She feels anxious all the time and the anxiety has negatively impacted her.  States that she was afraid to take jobs in the past because of it.  Current job that she has, on the first day she sat out in her car in the parking lot for quite some time trying to calm herself down to go inside.  Also reports feeling paranoid as though people are watching her all the time or that someone is out to get her.  Reports being prescribed Lexapro in the past but did not take it long enough to know whether it was helpful or not.  "I get some really bad about taking pills."  She had seen a therapist about 2 to 3 years ago and only a few times.  Past medical, social history and family history reviewed. Patient Active Problem List   Diagnosis Date Noted   Nexplanon in place 05/11/2019    URI, acute 02/24/2017   Anxiety and depression 11/18/2016   Intrinsic atopic dermatitis 11/18/2016   Knee pain, acute 02/13/2016   OSA (obstructive sleep apnea) 10/10/2015   Gastroesophageal reflux disease without esophagitis 10/08/2015   Other seasonal allergic rhinitis 10/03/2015   Allergic conjunctivitis 10/03/2015   Eczema 09/16/2015   Adjustment disorder with mixed anxiety and depressed mood 04/03/2015   Morbid obesity due to excess calories (HCC)      No current outpatient medications on file prior to visit.   No current facility-administered medications on file prior to visit.    Allergies  Allergen Reactions   Nickel     Social History   Socioeconomic History   Marital status: Single    Spouse name: Not on file   Number of children: Not on file   Years of education: Not on file   Highest education level: Not on file  Occupational History   Not on file  Tobacco Use   Smoking status: Current Some Day Smoker    Packs/day: 0.25   Smokeless tobacco: Never Used   Tobacco comment: she smokes irreg and GF smokes outdoors  Substance and Sexual Activity   Alcohol use: Yes    Alcohol/week: 1.0 standard drink  Types: 1 Shots of liquor per week    Comment: socially   Drug use: Yes    Types: Marijuana, Cocaine    Comment: last year- cocaine  marijuana- a couple of days ago   Sexual activity: Yes    Birth control/protection: Implant  Other Topics Concern   Not on file  Social History Narrative   Not on file   Social Determinants of Health   Financial Resource Strain: Not on file  Food Insecurity: Not on file  Transportation Needs: Not on file  Physical Activity: Not on file  Stress: Not on file  Social Connections: Not on file  Intimate Partner Violence: Not on file    Family History  Problem Relation Age of Onset   Autism Brother     No past surgical history on file.  ROS: Review of Systems Negative except as stated  above  PHYSICAL EXAM: BP 132/81    Pulse 74    Temp 99.5 F (37.5 C)    Resp 16    Ht 5\' 7"  (1.702 m)    Wt (!) 321 lb 12.8 oz (146 kg)    SpO2 98%    BMI 50.40 kg/m   Physical Exam  General appearance - alert, well appearing, obese young female and in no distress Mental status - normal mood, behavior, speech, dress, motor activity, and thought processes Nose - normal and patent, no erythema, discharge or polyps Mouth - mucous membranes moist, pharynx normal without lesions Neck - supple, no significant adenopathy Chest - clear to auscultation, no wheezes, rales or rhonchi, symmetric air entry Heart - normal rate, regular rhythm, normal S1, S2, no murmurs, rubs, clicks or gallops  CMP Latest Ref Rng & Units 11/18/2016 10/27/2014  Glucose 65 - 99 mg/dL 78 93  BUN 6 - 20 mg/dL 8 9  Creatinine 10/29/2014 - 1.00 mg/dL 7.59 1.63  Sodium 8.46 - 144 mmol/L 142 138  Potassium 3.5 - 5.2 mmol/L 4.6 3.6  Chloride 96 - 106 mmol/L 99 102  CO2 20 - 29 mmol/L 25 25  Calcium 8.7 - 10.2 mg/dL 659 9.4  Total Protein 6.0 - 8.5 g/dL 8.4 7.9  Total Bilirubin 0.0 - 1.2 mg/dL 0.3 0.5  Alkaline Phos 39 - 117 IU/L 142(H) 100  AST 0 - 40 IU/L 21 23  ALT 0 - 32 IU/L 16 17   Lipid Panel     Component Value Date/Time   CHOL 197 11/18/2016 1206   TRIG 97 11/18/2016 1206   HDL 56 11/18/2016 1206   CHOLHDL 3.5 11/18/2016 1206   LDLCALC 122 (H) 11/18/2016 1206    CBC    Component Value Date/Time   WBC 11.3 (H) 11/18/2016 1206   WBC 13.7 (H) 10/27/2014 1436   RBC 5.05 11/18/2016 1206   RBC 4.52 10/27/2014 1436   HGB 10.2 (A) 10/20/2017 1358   HGB 10.3 (L) 11/18/2016 1206   HCT 34.3 11/18/2016 1206   PLT 392 (H) 11/18/2016 1206   MCV 68 (L) 11/18/2016 1206   MCH 20.4 (L) 11/18/2016 1206   MCH 21.0 (L) 10/27/2014 1436   MCHC 30.0 (L) 11/18/2016 1206   MCHC 29.9 (L) 10/27/2014 1436   RDW 19.1 (H) 11/18/2016 1206   LYMPHSABS 2.5 11/18/2016 1206   MONOABS 0.5 10/27/2014 1436   EOSABS 0.1 11/18/2016 1206    BASOSABS 0.0 11/18/2016 1206    ASSESSMENT AND PLAN:  1. COVID-19 virus infection Overall she has improved by her report but she is concerned that  the cough lingers.  Advised patient that the cough may take several weeks to resolve.  I have given some Tessalon Perles to use as needed.  Given that her symptoms have improved and she has quarantine for more than 5 days, I told her that she can come out of quarantine but continue to wear her mask when in public.  Advised to get her booster shot about 90 days after this event. - benzonatate (TESSALON) 100 MG capsule; Take 1 capsule (100 mg total) by mouth 2 (two) times daily as needed for cough.  Dispense: 20 capsule; Refill: 0  2. Severe major depression, single episode (HCC) Discussed management of depression and anxiety.  Patient feels she would benefit from medication and counseling.  We will refer her to behavioral health.  We discussed putting her back on Lexapro with hydroxyzine to use as needed.  Advised patient that it may take about 4 weeks before she starts feeling better on the Lexapro.  If she has any worsening depression or develop suicidal ideation while on the medication she should let us know or be seen in the emergency room. - escitalopram (LEXAPRO) 10 MG tablet; 1/2 tab PO daily x 2 wks then 1 tab daily  Dispense: 30 tablet; Refill: 2 - Ambulatory referral to Psychiatry  3. Generalized anxiety disorder See #2 above - escitalopram (LEXAPRO) 10 MG tablet; 1/2 tab PO daily x 2 wks then 1 tab daily  Dispense: 30 tablet; Refill: 2 - hydrOXYzine (ATARAX/VISTARIL) 25 MG tablet; Take 1 tablet (25 mg total) by mouth 2 (two) times daily as needed for anxiety.  Dispense: 30 tablet; Refill: 1 - Ambulatory referral to Psychiatry  4. Elevated blood-pressure reading without diagnosis of hypertension DASH diet discussed and encouraged.  Plan to recheck blood pressure on follow-up visit.    Patient was given the opportunity to ask questions.   Patient verbalized understanding of the plan and was able to repeat key elements of the plan.   Orders Placed This Encounter  Procedures   Ambulatory referral to Psychiatry     Requested Prescriptions   Signed Prescriptions Disp Refills   benzonatate (TESSALON) 100 MG capsule 20 capsule 0    Sig: Take 1 capsule (100 mg total) by mouth 2 (two) times daily as needed for cough.   escitalopram (LEXAPRO) 10 MG tablet 30 tablet 2    Sig: 1/2 tab PO daily x 2 wks then 1 tab daily   hydrOXYzine (ATARAX/VISTARIL) 25 MG tablet 30 tablet 1    Sig: Take 1 tablet (25 mg total) by mouth 2 (two) times daily as needed for anxiety.    Return in about 6 weeks (around 08/02/2020) for Give appt with Jasmine in 2 weeks.  Jonah Blue, MD, FACP

## 2020-06-21 NOTE — Patient Instructions (Addendum)
Start Lexapro and hydroxyzine as discussed today. If any worsening of depression or thoughts of suicide, please give [place] a call or be seen in the emergency room.  We will have you follow-up with our licensed clinical social worker.  I have referred you to behavioral health.  Please follow-up with me again in about 6 weeks.  If you become pregnant or planning to become pregnant please let us know as your medication may need to be adjusted.   Major Depressive Disorder, Adult Major depressive disorder is a mental health condition. This disorder affects feelings. It can also affect the body. Symptoms of this condition last most of the day, almost every day, for 2 weeks. This disorder can affect:  Relationships.  Daily activities, such as work and school.  Activities that you normally like to do. What are the causes? The cause of this condition is not known. The disorder is likely caused by a mix of things, including:  Your personality, such as being a shy person.  Your behavior, or how you act toward others.  Your thoughts and feelings.  Too much alcohol or drugs.  How you react to stress.  Health and mental problems that you have had for a long time.  Things that hurt you in the past (trauma).  Big changes in your life, such as divorce. What increases the risk? The following factors may make you more likely to develop this condition:  Having family members with depression.  Being a woman.  Problems in the family.  Low levels of some brain chemicals.  Things that caused you pain as a child, especially if you lost a parent or were abused.  A lot of stress in your life, such as from: ? Living without basic needs of life, such as food and shelter. ? Being treated poorly because of race, sex, or religion (discrimination).  Health and mental problems that you have had for a long time. What are the signs or symptoms? The main symptoms of this condition are:  Being sad all  the time.  Being grouchy all the time.  Loss of interest in things and activities. Other symptoms include:  Sleeping too much or too little.  Eating too much or too little.  Gaining or losing weight, without knowing why.  Feeling tired or having low energy.  Being restless and weak.  Feeling hopeless, worthless, or guilty.  Trouble thinking clearly or making decisions.  Thoughts of hurting yourself or others, or thoughts of ending your life.  Spending a lot of time alone.  Inability to complete common tasks of daily life. If you have very bad MDD, you may:  Believe things that are not true.  Hear, see, taste, or feel things that are not there.  Have mild depression that lasts for at least 2 years.  Feel very sad and hopeless.  Have trouble speaking or moving. How is this treated? This condition may be treated with:  Talk therapy. This teaches you to know bad thoughts, feelings, and actions and how to change them. ? This can also help you to communicate with others. ? This can be done with members of your family.  Medicines. These can be used to treat worry (anxiety), depression, or low levels of chemicals in the brain.  Lifestyle changes. You may need to: ? Limit alcohol use. ? Limit drug use. ? Get regular exercise. ? Get plenty of sleep. ? Make healthy eating choices. ? Spend more time outdoors.  Brain stimulation. This  treatment excites the brain. This is done when symptoms are very bad or have not gotten better with other treatments. Follow these instructions at home: Activity  Get regular exercise as told.  Spend time outdoors as told.  Make time to do the things you enjoy.  Find ways to deal with stress. Try to: ? Meditate. ? Do deep breathing. ? Spend time in nature. ? Keep a journal.  Return to your normal activities as told by your doctor. Ask your doctor what activities are safe for you. Alcohol and drug use  If you drink  alcohol: ? Limit how much you use to:  0-1 drink a day for women.  0-2 drinks a day for men. ? Be aware of how much alcohol is in your drink. In the [place] one drink equals one 12 oz bottle of beer (355 mL), one 5 oz glass of wine (148 mL), or one 1 oz glass of hard liquor (44 mL).  Talk to your doctor about: ? Alcohol use. Alcohol can affect some medicines. ? Any drug use. General instructions  Take over-the-counter and prescription medicines and herbal preparations only as told by your doctor.  Eat a healthy diet.  Get a lot of sleep.  Think about joining a support group. Your doctor may be able to suggest one.  Keep all follow-up visits as told by your doctor. This is important.   Where to find more information:  [employer] on Mental Illness: www.nami.org  [place] [employer] of Mental Health: http://www.maynard.net/  American Psychiatric Association: www.psychiatry.org/patients-families/ Contact a doctor if:  Your symptoms get worse.  You get new symptoms. Get help right away if:  You hurt yourself.  You have serious thoughts about hurting yourself or others.  You see, hear, taste, smell, or feel things that are not there. If you ever feel like you may hurt yourself or others, or have thoughts about taking your own life, get help right away. Go to your nearest emergency department or:  Call your local emergency services (911 in the [place]  Call a suicide crisis helpline, such as the National Suicide Prevention Lifeline at 281-183-3803. This is open 24 hours a day in the [place]  Text the Crisis Text Line at 5704973257 (in the [place] Summary  Major depressive disorder is a mental health condition. This disorder affects feelings. Symptoms of this condition last most of the day, almost every day, for 2 weeks.  The symptoms of this disorder can cause problems with relationships and with daily activities.  There are treatments and support for people who get this  disorder. You may need more than one type of treatment.  Get help right away if you have serious thoughts about hurting yourself or others. This information is not intended to replace advice given to you by your health care provider. Make sure you discuss any questions you have with your health care provider. Document Revised: 04/29/2019 Document Reviewed: 04/29/2019 Elsevier Patient Education  2021 [employer].

## 2020-06-27 ENCOUNTER — Encounter: Payer: Self-pay | Admitting: Internal Medicine

## 2020-07-24 MED FILL — ESCITALOPRAM 10 MG TABLET: 10 | 14 days supply | Qty: 7 | Fill #1

## 2020-07-24 MED FILL — hydrOXYzine HCL 25 MG TABS: 25 | 15 days supply | Qty: 30 | Fill #1

## 2020-08-19 ENCOUNTER — Other Ambulatory Visit: Payer: Self-pay

## 2020-08-19 ENCOUNTER — Telehealth (HOSPITAL_COMMUNITY): Payer: No Payment, Other

## 2020-09-05 ENCOUNTER — Ambulatory Visit: Payer: Medicaid Other | Admitting: Internal Medicine

## 2021-02-18 ENCOUNTER — Telehealth: Payer: Self-pay | Admitting: Internal Medicine

## 2021-02-18 DIAGNOSIS — F411 Generalized anxiety disorder: Secondary | ICD-10-CM

## 2021-02-18 DIAGNOSIS — F322 Major depressive disorder, single episode, severe without psychotic features: Secondary | ICD-10-CM

## 2021-02-18 NOTE — Telephone Encounter (Signed)
Copied from CRM [id number]. Topic: Referral - Request for Referral >> Feb 12, 2021  4:21 PM Wyonia Hough E wrote: Has patient seen PCP for this complaint? Yes  *If NO, is insurance requiring patient see PCP for this issue before PCP can refer them? Referral for which specialty: therapist or psychiatry  Preferred provider/office:  Reason for referral: to speak with someone about mental health

## 2021-02-19 NOTE — Telephone Encounter (Signed)
Dr. Laural Benes would you be able to place another referral. Pt no showed appt back in March

## 2021-03-10 NOTE — Telephone Encounter (Signed)
I spoke with this pt and provided her with contact information and walk -in hours for [facility]. Pt mentioned that she was going to go next week. I also scheduled pt for an appointment with me for 03/26/21.   She is asking about the status of her orange card?

## 2021-03-11 ENCOUNTER — Other Ambulatory Visit: Payer: Self-pay

## 2021-03-11 ENCOUNTER — Ambulatory Visit: Payer: Self-pay | Attending: Internal Medicine | Admitting: Internal Medicine

## 2021-03-25 ENCOUNTER — Telehealth: Payer: Self-pay | Admitting: Internal Medicine

## 2021-03-25 NOTE — Telephone Encounter (Signed)
Asante out of the office 10/26. Called pt no answer & vm not set up, Unable to get appt rescheduled. If pt call back transfer to office to get scheduled with Asante.

## 2021-03-26 ENCOUNTER — Institutional Professional Consult (permissible substitution): Payer: Medicaid Other | Admitting: Clinical

## 2021-07-12 ENCOUNTER — Encounter (HOSPITAL_COMMUNITY): Payer: Self-pay | Admitting: Emergency Medicine

## 2021-07-12 ENCOUNTER — Other Ambulatory Visit: Payer: Self-pay

## 2021-07-12 ENCOUNTER — Emergency Department (HOSPITAL_COMMUNITY): Payer: Self-pay

## 2021-07-12 ENCOUNTER — Emergency Department (HOSPITAL_COMMUNITY)
Admission: EM | Admit: 2021-07-12 | Discharge: 2021-07-12 | Disposition: A | Discharge status: Home / Self Care | Payer: Self-pay | Attending: Emergency Medicine | Admitting: Emergency Medicine

## 2021-07-12 DIAGNOSIS — X58XXXA Exposure to other specified factors, initial encounter: Secondary | ICD-10-CM | POA: Insufficient documentation

## 2021-07-12 DIAGNOSIS — S0502XA Injury of conjunctiva and corneal abrasion without foreign body, left eye, initial encounter: Secondary | ICD-10-CM | POA: Insufficient documentation

## 2021-07-12 DIAGNOSIS — Y990 Civilian activity done for income or pay: Secondary | ICD-10-CM | POA: Insufficient documentation

## 2021-07-12 DIAGNOSIS — M25531 Pain in right wrist: Secondary | ICD-10-CM | POA: Insufficient documentation

## 2021-07-12 DIAGNOSIS — S0501XA Injury of conjunctiva and corneal abrasion without foreign body, right eye, initial encounter: Secondary | ICD-10-CM | POA: Insufficient documentation

## 2021-07-12 DIAGNOSIS — N069 Isolated proteinuria with unspecified morphologic lesion: Secondary | ICD-10-CM | POA: Insufficient documentation

## 2021-07-12 LAB — URINALYSIS, ROUTINE W REFLEX MICROSCOPIC
Bilirubin Urine: NEGATIVE
Glucose, UA: NEGATIVE mg/dL
Hgb urine dipstick: NEGATIVE
Ketones, ur: 5 mg/dL — AB
Leukocytes,Ua: NEGATIVE
Nitrite: NEGATIVE
Protein, ur: 30 mg/dL — AB
Specific Gravity, Urine: 1.019 (ref 1.005–1.030)
pH: 8 (ref 5.0–8.0)

## 2021-07-12 LAB — PREGNANCY, URINE: Preg Test, Ur: NEGATIVE

## 2021-07-12 MED ORDER — FLUORESCEIN SODIUM 1 MG OP STRP
2.0000 | ORAL_STRIP | Freq: Once | OPHTHALMIC | Status: AC
Start: 2021-07-12 — End: 2021-07-12
  Administered 2021-07-12: 2 via OPHTHALMIC
  Filled 2021-07-12: qty 2

## 2021-07-12 MED ORDER — TETRACAINE HCL 0.5 % OP SOLN
2.0000 [drp] | Freq: Once | OPHTHALMIC | Status: AC
  Administered 2021-07-12: 2 [drp] via OPHTHALMIC
  Filled 2021-07-12: qty 4

## 2021-07-12 MED ORDER — ERYTHROMYCIN 5 MG/GM OP OINT
1.0000 "application " | TOPICAL_OINTMENT | Freq: Once | OPHTHALMIC | Status: AC
  Administered 2021-07-12: 1 via OPHTHALMIC
  Filled 2021-07-12: qty 3.5

## 2021-07-12 MED ORDER — ERYTHROMYCIN 5 MG/GM OP OINT: TOPICAL_OINTMENT | OPHTHALMIC | 0 refills | Status: DC

## 2021-07-12 NOTE — ED Provider Notes (Signed)
[facility] EMERGENCY DEPARTMENT Provider Note   CSN: [id number] Arrival date & time: 07/12/21  [id number]     History  Chief Complaint  Patient presents with   eyes swelling   Wrist Pain    Carrie Allen is a 26 y.o. female.  HPI     26 year old female comes in with chief complaint of right wrist pain for 1 month and swelling around her eyes for 2 days.  Primary complaint is swelling around eye for 2 days.  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  While cleaning she will often have her come in contact with fluids (splash or contact with her hand as she is trying to move her hair).  2 days ago she started having itching to her eye.  Yesterday the eye was quite red.  Today the redness has resolved but there is persistent irritation and swelling around her eyes which got her concerned.  She denies any vision loss, but thinks maybe her vision is slightly blurry.  No pain in her eye.  No photosensitivity.  She denies any liver disease, kidney disease and denies any heavy alcohol use or smoking.  No history of recent URI-like symptoms.  No swelling to her legs or her hands.  Additionally, patient indicates that about a month ago she fell asleep on her right wrist.  Thereafter she has been having pain and discomfort with certain movement of the wrist and also when she is trying to carry moderately heavy items like garbage bags.  No numbness or tingling at any point.  No neck pain.  Home Medications Prior to Admission medications   Medication Sig Start Date End Date Taking? Authorizing Provider  erythromycin ophthalmic ointment Place a 1/2 inch ribbon of ointment into the lower eyelid. 07/12/21   Varney Biles, MD  escitalopram (LEXAPRO) 10 MG tablet TAKE 1/2 TABLET BY MOUTH ONCE DAILY FOR 2 WEEKS THEN 1 TABLET DAILY 06/21/20 06/21/21  Ladell Pier, MD      Allergies    Nickel    Review of Systems   Review of  Systems  All other systems reviewed and are negative.  Physical Exam Updated Vital Signs BP 129/77    Pulse 96    Temp 98.7 F (37.1 C) (Oral)    Resp 18    LMP 06/28/2021    SpO2 98%  Physical Exam Vitals and nursing note reviewed.  Constitutional:      Appearance: She is well-developed.  HENT:     Head: Atraumatic.  Eyes:     Comments: Bilateral eye exam: EOMI, PERRL No photophobia, no chemosis Gross bedside visual exam with finger counting is normal Eye lid eversion reveals no foreign body Fluorecin test under woods lamp reveals dye uptake in both of the eyes.  Over the right eye, there is linear appearing dye uptake going from 10:00 to 8:00 clock.  Over the left eye there is corneal abrasion over the superior aspect of the cornea.   Cardiovascular:     Rate and Rhythm: Normal rate.  Pulmonary:     Effort: Pulmonary effort is normal.  Musculoskeletal:        General: Tenderness present. No swelling or deformity.     Cervical back: Normal range of motion and neck supple.     Comments: Tenderness of the right distal forearm area with pronation, supination and with wrist extension and flexion.  No gross deformity.  Johney Maine  sensory exam of the hand is normal.  Skin:    General: Skin is warm and dry.  Neurological:     Mental Status: She is alert and oriented to person, place, and time.    ED Results / Procedures / Treatments   Labs (all labs ordered are listed, but only abnormal results are displayed) Labs Reviewed  URINALYSIS, ROUTINE W REFLEX MICROSCOPIC - Abnormal; Notable for the following components:      Result Value   APPearance HAZY (*)    Ketones, ur 5 (*)    Protein, ur 30 (*)    Bacteria, UA RARE (*)    All other components within normal limits  PREGNANCY, URINE    EKG None  Radiology DG Wrist Complete Right  Result Date: 07/12/2021 CLINICAL DATA:  Wrist pain/injury EXAM: RIGHT WRIST - COMPLETE 3+ VIEW COMPARISON:  None. FINDINGS: No fracture or  dislocation is seen. The joint spaces are preserved. Visualized soft tissues are within normal limits. IMPRESSION: Negative. Electronically Signed   By: Julian Hy M.D.   On: 07/12/2021 20:35    Procedures Procedures    Medications Ordered in ED Medications  fluorescein ophthalmic strip 2 strip (2 strips Both Eyes Given by Other 07/12/21 2105)  tetracaine (PONTOCAINE) 0.5 % ophthalmic solution 2 drop (2 drops Both Eyes Given by Other 07/12/21 2105)  erythromycin ophthalmic ointment 1 application (1 application Both Eyes Given 07/12/21 2112)    ED Course/ Medical Decision Making/ A&P                           Medical Decision Making Amount and/or Complexity of Data Reviewed Labs: ordered. Radiology: ordered.  Risk Prescription drug management.   This patient presents to the ED with chief complaint(s) of eye swelling, itching without eye pain and right wrist pain with pertinent past medical history of no significant medical history.  The differential diagnosis includes : Corneal abrasion, nephrotic syndrome, conjunctivitis (chemical).  Nerve palsy of the right wrist, sprain/strain of the wrist.  The initial plan is to get x-ray of the wrist along with ocular evaluation.  Urine analysis, urine pregnancy and CMP ordered to ensure patient does not have low albumin state.  Patient however declined CMP.  He does not have insurance and denies any heavy drinking or any history of liver or kidney problems.  Ocular exam consistent with corneal abrasion.  Bilateral corneal abrasion could be a result of her scratching the eye because of irritation versus the primary cause for the itching and irritation given reported chemical exposure to the eye.  There is not any ecchymosis or evidence of infection.  We will apply erythromycin right now.  We will give her ophthalmology follow-up.  Return precautions discussed.   Wrist x-ray reviewed independently.  No evidence of any fracture or  dislocation.:  Wellness follow-up recommended.  RICE treatment requested.  Number and complexity of problems evaluated during this encounter: - Moderate: 1 acute illness with systemic symptoms - Moderate: 1 acute, complicated injury   Risk of Morbidity, Mortality, or Complications - Risk level: Moderate: prescription drug management, minor/major surgery decision, or limited by social determinants of health  SDO H: Lack of insurance -good Rx coupon provided.  Erythromycin should be around $5 with it.  Final Clinical Impression(s) / ED Diagnoses Final diagnoses:  Bilateral corneal abrasions, initial encounter  Isolated proteinuria with morphologic lesion  Right wrist pain    Rx / DC Orders ED Discharge Orders  Ordered    erythromycin ophthalmic ointment  Status:  Discontinued        07/12/21 2103    erythromycin ophthalmic ointment        07/12/21 2103              Varney Biles, MD 07/12/21 2318

## 2021-07-12 NOTE — ED Triage Notes (Signed)
C/o itchy, swollen eyes x 2 days.    Also reports R wrist pain since sleeping on arm 1 month ago.

## 2021-07-12 NOTE — Discharge Instructions (Addendum)
You were seen in the ER for swelling around your eye and irritation of your eye.  Both of your eyes show evidence of corneal abrasion.  This could be because of initial exposure to chemical or because of scratching to your eye.  You will need to apply antibiotic ointment to your eye that is prescribed.  You will also need to follow-up with an eye specialist in 3 days -call the number provided.  Additionally, the x-ray of your wrist is normal. We recommend that he follow-up with: Wellness for further assessment.

## 2021-10-28 ENCOUNTER — Other Ambulatory Visit (HOSPITAL_COMMUNITY): Payer: Self-pay

## 2022-05-01 DIAGNOSIS — Z419 Encounter for procedure for purposes other than remedying health state, unspecified: Secondary | ICD-10-CM | POA: Diagnosis not present

## 2022-05-20 ENCOUNTER — Ambulatory Visit: Payer: Medicaid Other | Admitting: Family

## 2022-05-20 ENCOUNTER — Encounter: Payer: Self-pay | Admitting: Family

## 2022-05-20 NOTE — Progress Notes (Deleted)
   New Patient Office Visit  Subjective:  Patient ID: Carrie Allen, female    DOB: 08-04-1995  Age: 26 y.o. MRN: [id number]  CC: No chief complaint on file.   HPI Carrie Allen presents for establishing care today.  Assessment & Plan:   Problem List Items Addressed This Visit   None   Subjective:    Outpatient Medications Prior to Visit  Medication Sig Dispense Refill  . erythromycin ophthalmic ointment Place a 1/2 inch ribbon of ointment into the lower eyelid. 3.5 g 0  . escitalopram (LEXAPRO) 10 MG tablet TAKE 1/2 TABLET BY MOUTH ONCE DAILY FOR 2 WEEKS THEN 1 TABLET DAILY 30 tablet 2   No facility-administered medications prior to visit.   Past Medical History:  Diagnosis Date  . Obesity   . Sleep apnea    No past surgical history on file.  Objective:   Today's Vitals: There were no vitals taken for this visit.  Physical Exam Vitals and nursing note reviewed.  Constitutional:      Appearance: Normal appearance.  Cardiovascular:     Rate and Rhythm: Normal rate and regular rhythm.  Pulmonary:     Effort: Pulmonary effort is normal.     Breath sounds: Normal breath sounds.  Musculoskeletal:        General: Normal range of motion.  Skin:    General: Skin is warm and dry.  Neurological:     Mental Status: She is alert.  Psychiatric:        Mood and Affect: Mood normal.        Behavior: Behavior normal.   No orders of the defined types were placed in this encounter.   Dulce Sellar, NP

## 2022-05-26 ENCOUNTER — Ambulatory Visit: Payer: Self-pay

## 2022-05-26 NOTE — Telephone Encounter (Signed)
Chief Complaint: Missed period 8 days late Symptoms: Breast tenderness, nauseated Frequency: N/A Pertinent Negatives: Patient denies other symptoms Disposition: [] ED /[x] Urgent Care (no appt availability in office) / [] Appointment(In office/virtual)/ []  [facility]/ [] Home Care/ [] Refused Recommended Disposition /[] [facility]/ []  Follow-up with PCP Additional Notes: No availability in the office this week, patient wants to be seen this week for testing, she says she will go to the UC tonight for blood work, negative home tests x 6 over course of a week.     Summary: Pregnancy test questions   Pt called to report that she has experienced a delay in her menstrual cycle and wants to speak to a nurse about pregnancy testing.  Best contact: [phone]     Reason for Disposition  Wants a pregnancy test done in the office  Answer Assessment - Initial Assessment Questions 1. LMP:  "When did your last menstrual period begin?"     11/20-11/24/23 2. DAYS LATE: "How many days late is your menstrual period?"     8 today 3. REGULARITY: "How regular are your periods?"     Yes 4. PREGNANCY: "Is there any chance you are pregnant?" (e.g., unprotected intercourse, missed birth control pill, broken condom) "Have you used a home pregnancy test?"     Yes, home test negative taken at least 6 times  5. BREASTFEEDING: "Are you breastfeeding?"     No 6. BIRTH CONTROL PILLS: "Are you taking birth control pills, or have you stopped recently?"     N/A 7. LONG-ACTING CONTRACEPTION: "Has your doctor given you a shot to prevent pregnancy?" (e.g., Depo-Provera injection) "Do you have an intrauterine device (IUD)".     Taken out 2021 8. OTHER SYMPTOMS: "Do you have any other symptoms?" (e.g., abdomen pain)     Nauseated, breast tenderness  Protocols used: Menstrual Period - Missed or Late-A-AH

## 2022-05-27 ENCOUNTER — Encounter (HOSPITAL_COMMUNITY): Payer: Self-pay

## 2022-05-27 ENCOUNTER — Ambulatory Visit (HOSPITAL_COMMUNITY)
Admission: EM | Admit: 2022-05-27 | Discharge: 2022-05-27 | Disposition: A | Discharge status: Home / Self Care | Payer: Medicaid Other | Attending: Urgent Care | Admitting: Urgent Care

## 2022-05-27 DIAGNOSIS — N926 Irregular menstruation, unspecified: Secondary | ICD-10-CM

## 2022-05-27 DIAGNOSIS — R748 Abnormal levels of other serum enzymes: Secondary | ICD-10-CM | POA: Diagnosis not present

## 2022-05-27 DIAGNOSIS — D649 Anemia, unspecified: Secondary | ICD-10-CM

## 2022-05-27 LAB — COMPREHENSIVE METABOLIC PANEL
ALT: 17 U/L (ref 0–44)
AST: 20 U/L (ref 15–41)
Albumin: 4.2 g/dL (ref 3.5–5.0)
Alkaline Phosphatase: 90 U/L (ref 38–126)
Anion gap: 8 (ref 5–15)
BUN: 9 mg/dL (ref 6–20)
CO2: 29 mmol/L (ref 22–32)
Calcium: 9.9 mg/dL (ref 8.9–10.3)
Chloride: 101 mmol/L (ref 98–111)
Creatinine, Ser: 0.67 mg/dL (ref 0.44–1.00)
GFR, Estimated: >60 mL/min (ref >60)
Glucose, Bld: 87 mg/dL (ref 70–99)
Potassium: 4.1 mmol/L (ref 3.5–5.1)
Sodium: 138 mmol/L (ref 135–145)
Total Bilirubin: 0.6 mg/dL (ref 0.3–1.2)
Total Protein: 8.9 g/dL — ABNORMAL HIGH (ref 6.5–8.1)

## 2022-05-27 LAB — CBC WITH DIFFERENTIAL/PLATELET
Abs Immature Granulocytes: 0.03 10*3/uL (ref 0.00–0.07)
Basophils Absolute: 0.1 10*3/uL (ref 0.0–0.1)
Basophils Relative: 0 %
Eosinophils Absolute: 0.1 10*3/uL (ref 0.0–0.5)
Eosinophils Relative: 1 %
HCT: 43.7 % (ref 36.0–46.0)
Hemoglobin: 12.8 g/dL (ref 12.0–15.0)
Immature Granulocytes: 0 %
Lymphocytes Relative: 21 %
Lymphs Abs: 2.4 10*3/uL (ref 0.7–4.0)
MCH: 23.2 pg — ABNORMAL LOW (ref 26.0–34.0)
MCHC: 29.3 g/dL — ABNORMAL LOW (ref 30.0–36.0)
MCV: 79.3 fL — ABNORMAL LOW (ref 80.0–100.0)
Monocytes Absolute: 0.4 10*3/uL (ref 0.1–1.0)
Monocytes Relative: 4 %
Neutro Abs: 8.2 10*3/uL — ABNORMAL HIGH (ref 1.7–7.7)
Neutrophils Relative %: 74 %
Platelets: 358 10*3/uL (ref 150–400)
RBC: 5.51 MIL/uL — ABNORMAL HIGH (ref 3.87–5.11)
RDW: 18 % — ABNORMAL HIGH (ref 11.5–15.5)
WBC: 11.2 10*3/uL — ABNORMAL HIGH (ref 4.0–10.5)
nRBC: 0 % (ref 0.0–0.2)

## 2022-05-27 LAB — IRON AND TIBC
Iron: 39 ug/dL (ref 28–170)
Saturation Ratios: 7 % — ABNORMAL LOW (ref 10.4–31.8)
TIBC: 588 ug/dL — ABNORMAL HIGH (ref 250–450)
UIBC: 549 ug/dL

## 2022-05-27 LAB — HCG, QUANTITATIVE, PREGNANCY: hCG, Beta Chain, Quant, S: 1 m[IU]/mL (ref <5)

## 2022-05-27 LAB — POC URINE PREG, ED: Preg Test, Ur: NEGATIVE

## 2022-05-27 NOTE — Discharge Instructions (Signed)
Your urine pregnancy test is negative. We have obtained a serum hCG test for confirmation. Given your history of anemia, we have rechecked your labs. We will notify you with results of your labs once received. Please establish care with a PCP to schedule a follow-up.

## 2022-05-27 NOTE — Telephone Encounter (Signed)
Patient being evaluated in urgent care per epic.

## 2022-05-27 NOTE — ED Provider Notes (Signed)
[facility]    CSN: [id number] Arrival date & time: 05/27/22  1114      History   Chief Complaint Chief Complaint  Patient presents with   Possible Pregnancy    HPI Carrie Allen is a 26 y.o. female.   26yo female presents today concerned about possible pregnancy. States she is 10 days late for her menstrual period. Took a pregnancy test at home which was negative. Additionally, pt reports lower back pain, denies known injury.  Otherwise, denies any pregnancy symptoms.  States her last normal menstrual period was 4 days from November 20 to 24.  She states her menstrual periods are every 28 days.  Patient does have a history of anemia, for which she was told she should be taking iron.  Last evaluation of her CBC showed a hemoglobin of 10.3.  Of note, patient has had a persistent leukocytosis.  She states none of her labs have been evaluated and has not had any blood work for at least 5 years.  Patient denies pica.  Patient is requesting a serum hCG test.   Possible Pregnancy    Past Medical History:  Diagnosis Date   Adjustment disorder with mixed anxiety and depressed mood 04/03/2015   Allergic conjunctivitis 10/03/2015   Knee pain, acute 02/13/2016   Obesity    Sleep apnea    URI, acute 02/24/2017    Patient Active Problem List   Diagnosis Date Noted   Nexplanon in place 05/11/2019   Anxiety and depression 11/18/2016   Intrinsic atopic dermatitis 11/18/2016   OSA (obstructive sleep apnea) 10/10/2015   Gastroesophageal reflux disease without esophagitis 10/08/2015   Other seasonal allergic rhinitis 10/03/2015   Eczema 09/16/2015   Morbid obesity due to excess calories ([place])     History reviewed. No pertinent surgical history.  OB History   No obstetric history on file.      Home Medications    Prior to Admission medications   Not on File    Family History Family History  Problem Relation Age of Onset   Autism Brother     Social  History Social History   Tobacco Use   Smoking status: Some Days    Packs/day: 0.25    Types: Cigarettes   Smokeless tobacco: Never   Tobacco comments:    she smokes irreg and GF smokes outdoors  Substance Use Topics   Alcohol use: Yes    Alcohol/week: 1.0 standard drink of alcohol    Types: 1 Shots of liquor per week    Comment: socially   Drug use: Yes    Types: Marijuana, Cocaine    Comment: last year- cocaine  marijuana- a couple of days ago     Allergies   Nickel   Review of Systems Review of Systems As per HPI  Physical Exam Triage Vital Signs ED Triage Vitals [05/27/22 1435]  Enc Vitals Group     BP 119/82     Pulse Rate 91     Resp 18     Temp 99.4 F (37.4 C)     Temp Source Oral     SpO2 100 %     Weight      Height      Head Circumference      Peak Flow      Pain Score 0     Pain Loc      Pain Edu?      Excl. in [place]?    No data found.  Updated Vital Signs BP 119/82 (BP Location: Right Arm)   Pulse 91   Temp 99.4 F (37.4 C) (Oral)   Resp 18   LMP 04/20/2022 (Exact Date)   SpO2 100%   Visual Acuity Right Eye Distance:   Left Eye Distance:   Bilateral Distance:    Right Eye Near:   Left Eye Near:    Bilateral Near:     Physical Exam Vitals and nursing note reviewed.  Constitutional:      General: She is not in acute distress.    Appearance: She is well-developed. She is obese. She is not ill-appearing, toxic-appearing or diaphoretic.  HENT:     Head: Normocephalic and atraumatic.     Mouth/Throat:     Comments: Mild sublingual pallor Eyes:     Conjunctiva/sclera: Conjunctivae normal.     Comments: Mild conjunctival pallor noted  Neck:     Thyroid: No thyroid mass, thyromegaly or thyroid tenderness.  Cardiovascular:     Rate and Rhythm: Normal rate and regular rhythm.  Pulmonary:     Effort: Pulmonary effort is normal. No respiratory distress.     Breath sounds: Normal breath sounds.  Abdominal:     Palpations: Abdomen is  soft.     Tenderness: There is no abdominal tenderness.  Musculoskeletal:        General: No swelling.     Cervical back: Normal range of motion and neck supple. No rigidity or tenderness.  Lymphadenopathy:     Cervical: No cervical adenopathy.  Skin:    General: Skin is warm and dry.     Capillary Refill: Capillary refill takes less than 2 seconds.     Findings: No erythema or rash.  Neurological:     General: No focal deficit present.     Mental Status: She is alert and oriented to person, place, and time.  Psychiatric:        Mood and Affect: Mood normal.        Behavior: Behavior normal.      UC Treatments / Results  Labs (all labs ordered are listed, but only abnormal results are displayed) Labs Reviewed  CBC WITH DIFFERENTIAL/PLATELET  COMPREHENSIVE METABOLIC PANEL  HCG, QUANTITATIVE, PREGNANCY  IRON AND TIBC  POC URINE PREG, ED    EKG   Radiology No results found.  Procedures Procedures (including critical care time)  Medications Ordered in UC Medications - No data to display  Initial Impression / Assessment and Plan / UC Course  I have reviewed the triage vital signs and the nursing notes.  Pertinent labs & imaging results that were available during my care of the patient were reviewed by me and considered in my medical decision making (see chart for details).     Late menses - pt states she is 10 days late. From my count, it appears she is only 4-5. Urine pregnancy negative. Will do serum hcg. Pt denies weight loss or dietary changes. Anemia -patient with history of the same.  Last time this was evaluated was in 2018.  Patient was encouraged to take an iron supplement but is not.  We will check an entire CBC as she has also had chronic leukocytosis and a microcytosis. Alk phos elevation -noted on previous labs as well.  Only mildly elevated, however would like to recheck today to ensure it does not continue to decline.  Encouraged patient to call Dr.  Wynetta Emery who used to be her PCP, and reestablish care.   Final Clinical Impressions(s) /  UC Diagnoses   Final diagnoses:  Late menses  Anemia, unspecified type  Alkaline phosphatase elevation     Discharge Instructions      Your urine pregnancy test is negative. We have obtained a serum hCG test for confirmation. Given your history of anemia, we have rechecked your labs. We will notify you with results of your labs once received. Please establish care with a PCP to schedule a follow-up.     ED Prescriptions   None    PDMP not reviewed this encounter.   Chaney Malling, [place] 05/27/22 [phone]

## 2022-05-27 NOTE — ED Triage Notes (Signed)
Patient is 10 days late for period, with lower back pain and would like blood draw for possible pregnancy. Did pregnancy test at home but was negative.

## 2022-06-01 DIAGNOSIS — Z419 Encounter for procedure for purposes other than remedying health state, unspecified: Secondary | ICD-10-CM | POA: Diagnosis not present

## 2022-07-02 DIAGNOSIS — Z419 Encounter for procedure for purposes other than remedying health state, unspecified: Secondary | ICD-10-CM | POA: Diagnosis not present

## 2022-07-06 ENCOUNTER — Encounter: Payer: Self-pay | Admitting: Internal Medicine

## 2022-07-31 DIAGNOSIS — Z419 Encounter for procedure for purposes other than remedying health state, unspecified: Secondary | ICD-10-CM | POA: Diagnosis not present

## 2022-08-20 IMAGING — DX DG WRIST COMPLETE 3+V*R*
3 series · 3 of 3 positions shown · non-contrast
Comparison: None.

CLINICAL DATA: Wrist pain/injury

EXAM:
RIGHT WRIST - COMPLETE 3+ VIEW

[wrist ap]
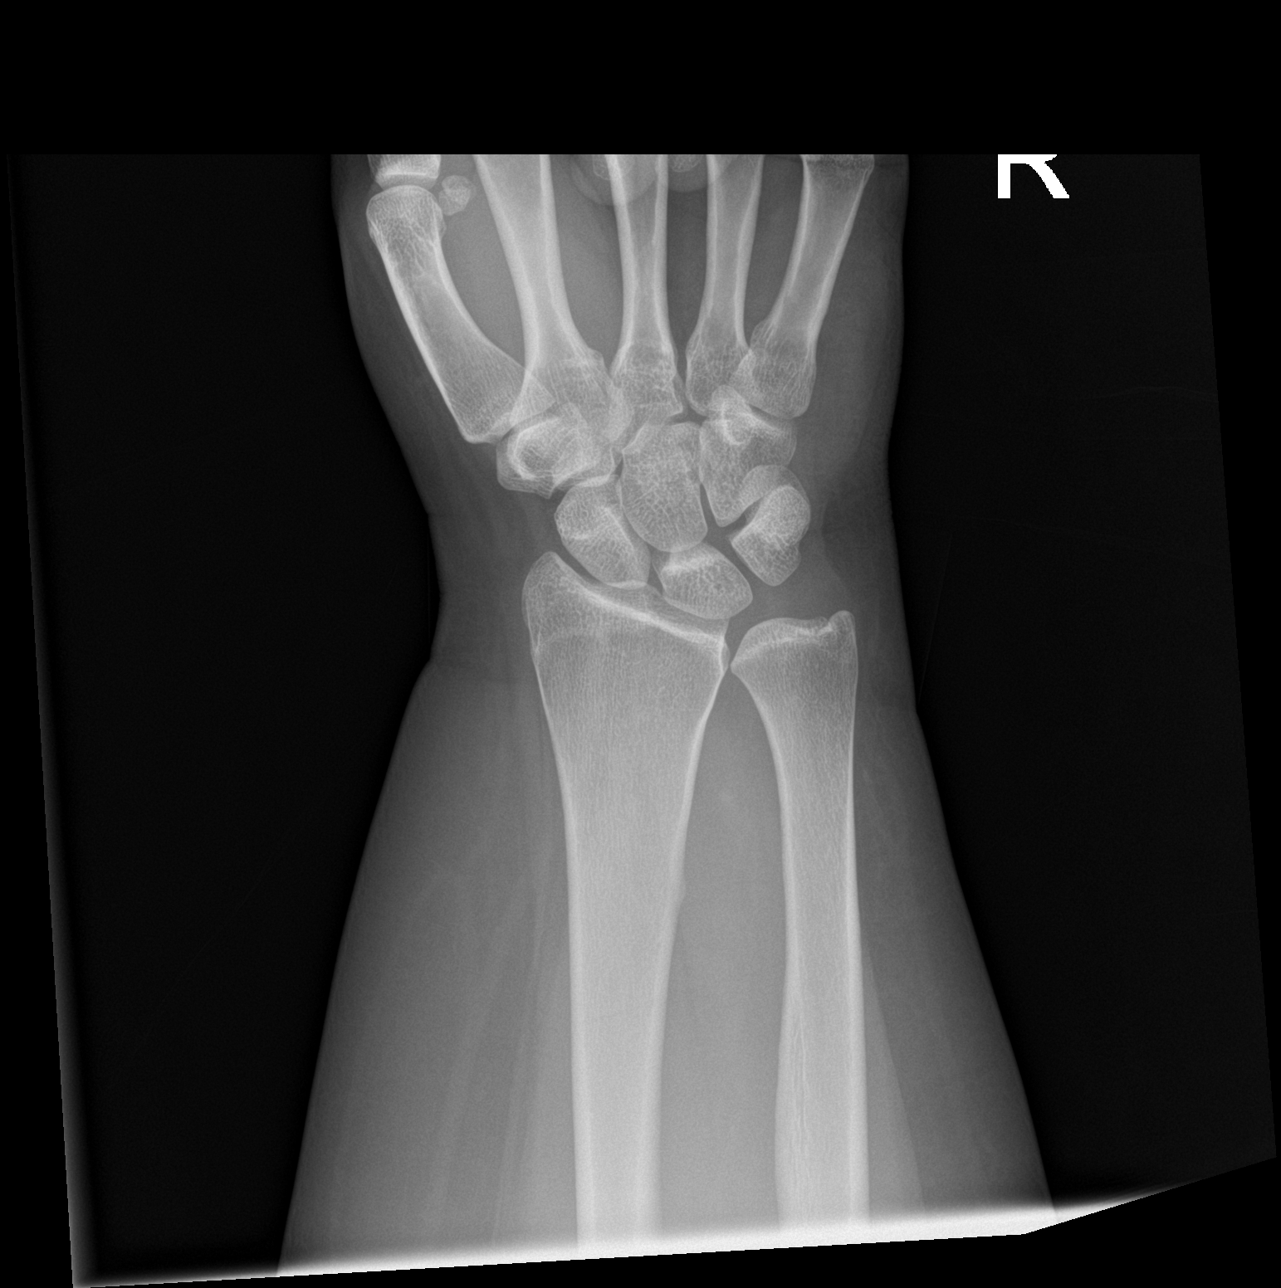

[wrist obl]
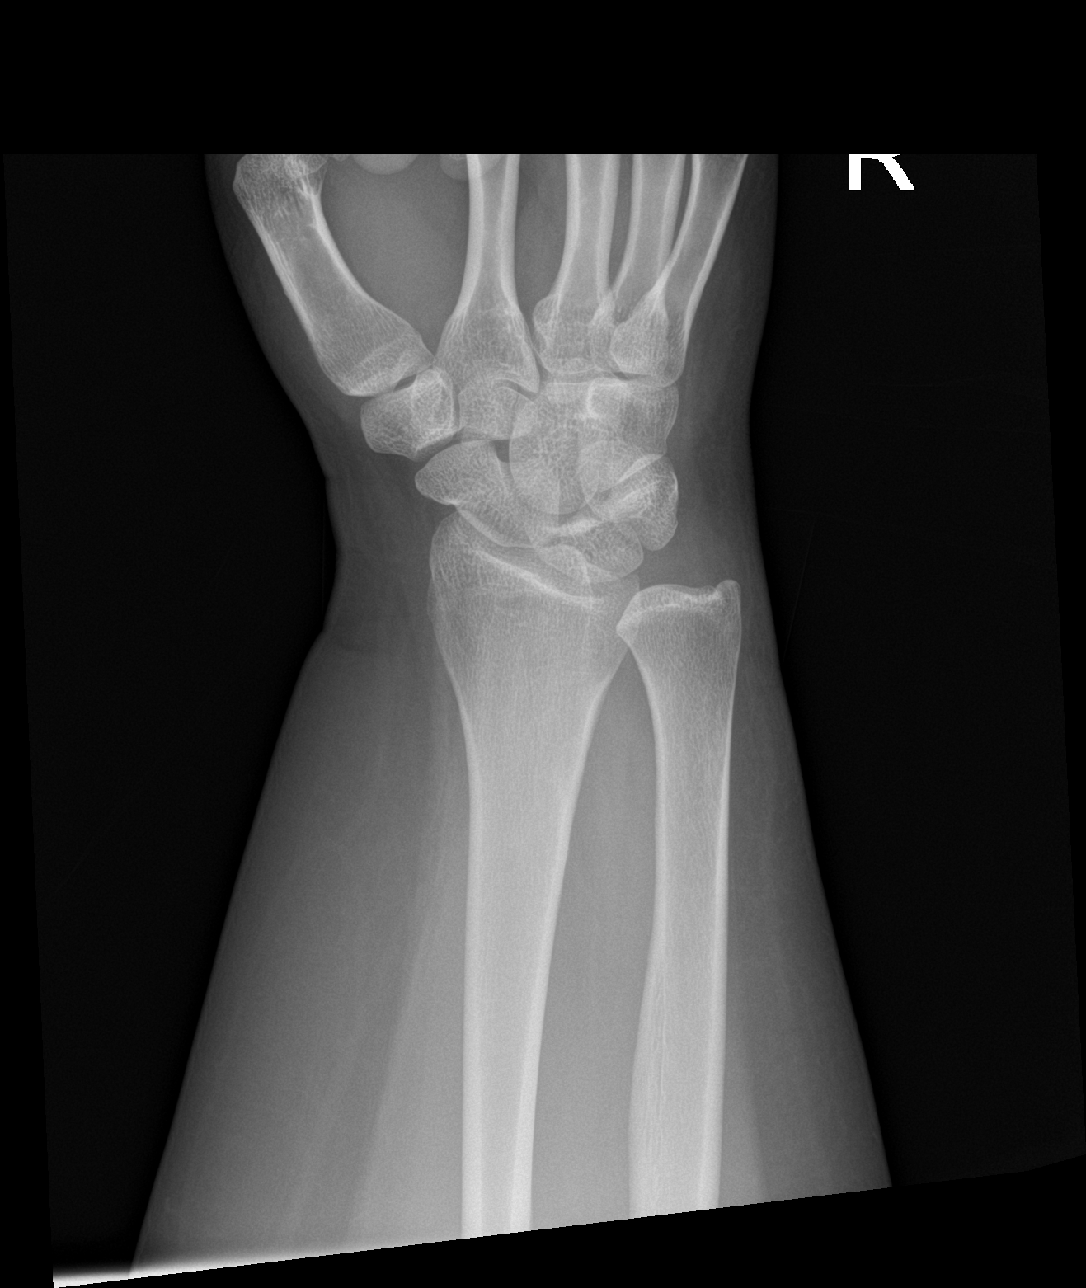

[wrist lat]
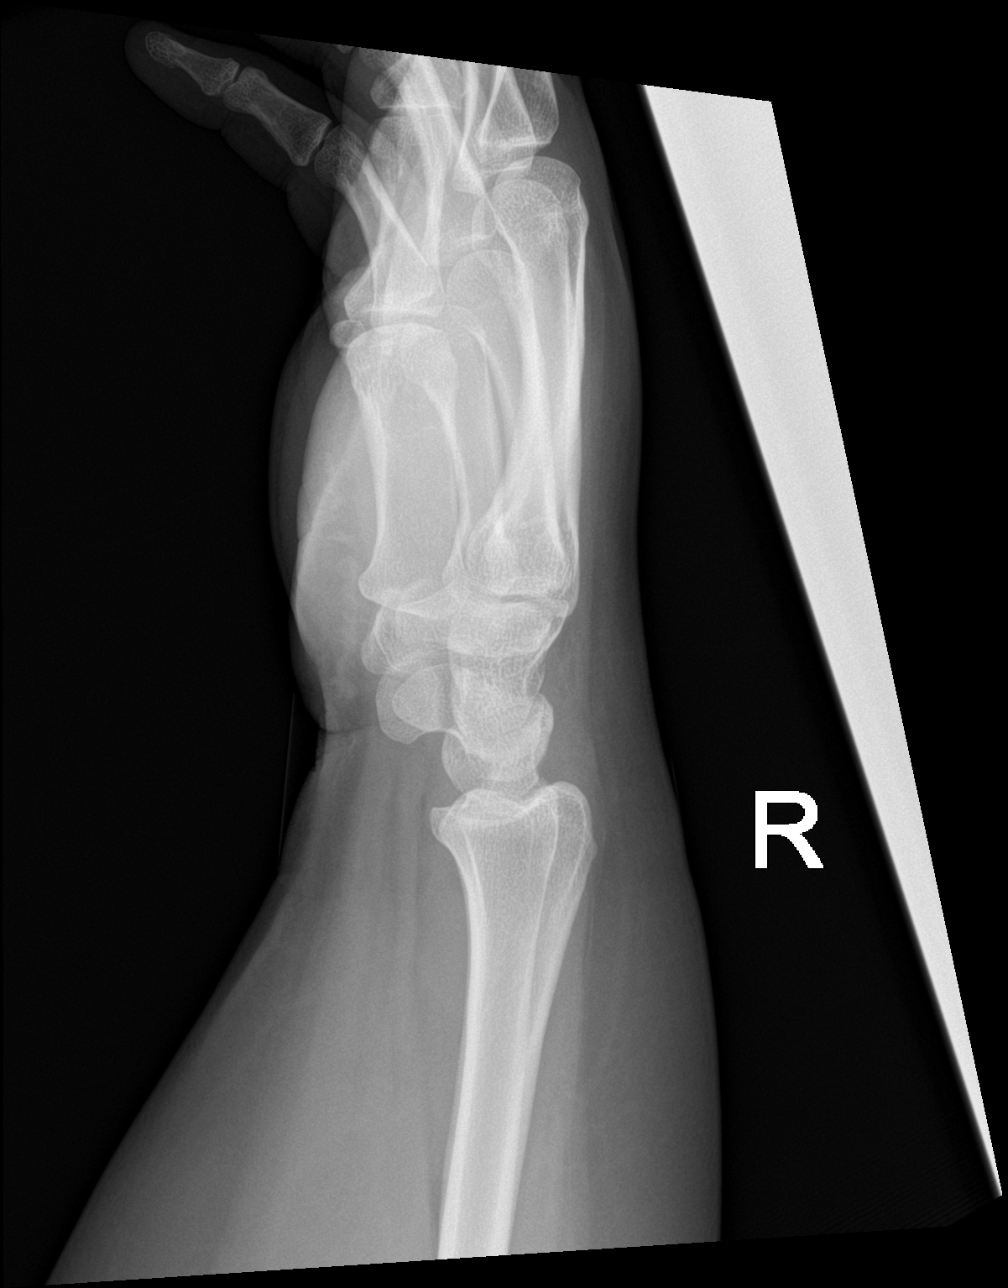

[3 of 3 positions shown; findings below may reference images not displayed]

FINDINGS: No fracture or dislocation is seen.

The joint spaces are preserved.

Visualized soft tissues are within normal limits.
IMPRESSION: Negative.

## 2022-08-31 DIAGNOSIS — Z419 Encounter for procedure for purposes other than remedying health state, unspecified: Secondary | ICD-10-CM | POA: Diagnosis not present

## 2022-11-05 ENCOUNTER — Telehealth: Payer: Medicaid Other | Admitting: Physician Assistant

## 2022-11-05 DIAGNOSIS — J208 Acute bronchitis due to other specified organisms: Secondary | ICD-10-CM

## 2022-11-05 DIAGNOSIS — B9689 Other specified bacterial agents as the cause of diseases classified elsewhere: Secondary | ICD-10-CM | POA: Diagnosis not present

## 2022-11-05 MED ORDER — FLUTICASONE PROPIONATE 50 MCG/ACT NA SUSP: 2.0000 | Freq: Every day | NASAL | 0 refills | Status: DC

## 2022-11-05 MED ORDER — AMOXICILLIN-POT CLAVULANATE 875-125 MG PO TABS: 1.0000 | ORAL_TABLET | Freq: Two times a day (BID) | ORAL | 0 refills | Status: DC

## 2022-11-05 MED ORDER — PROMETHAZINE-DM 6.25-15 MG/5ML PO SYRP: 5.0000 mL | ORAL_SOLUTION | Freq: Four times a day (QID) | ORAL | 0 refills | Status: DC | PRN

## 2022-11-05 NOTE — Patient Instructions (Signed)
Darleen Kallie Locks, thank you for joining Piedad Climes, PA-C for today's virtual visit.  While this provider is not your primary care provider (PCP), if your PCP is located in our provider database this encounter information will be shared with them immediately following your visit.   A [facility] MyChart account gives you access to today's visit and all your visits, tests, and labs performed at [facility] " click here if you don't have a [facility] MyChart account or go to [email or web address]  Consent: (Patient) Carrie Allen provided verbal consent for this virtual visit at the beginning of the encounter.  Current Medications: No current outpatient medications on file.   Medications ordered in this encounter:  No orders of the defined types were placed in this encounter.    *If you need refills on other medications prior to your next appointment, please contact your pharmacy*  Follow-Up: Call back or seek an in-person evaluation if the symptoms worsen or if the condition fails to improve as anticipated.  [facility] [phone]  Other Instructions Take antibiotic (Augmentin) as directed.  Increase fluids.  Get plenty of rest. Use Mucinex for congestion. Use the Flonase and Promethazine-DM as directed. Take a daily probiotic (I recommend Align or Culturelle, but even Activia Yogurt may be beneficial).  A humidifier placed in the bedroom may offer some relief for a dry, scratchy throat of nasal irritation.  Read information below on acute bronchitis. Please call or return to clinic if symptoms are not improving.  Acute Bronchitis Bronchitis is when the airways that extend from the windpipe into the lungs get red, puffy, and painful (inflamed). Bronchitis often causes thick spit (mucus) to develop. This leads to a cough. A cough is the most common symptom of bronchitis. In acute bronchitis, the condition usually begins suddenly and goes away  over time (usually in 2 weeks). Smoking, allergies, and asthma can make bronchitis worse. Repeated episodes of bronchitis may cause more lung problems.  HOME CARE Rest. Drink enough fluids to keep your pee (urine) clear or pale yellow (unless you need to limit fluids as told by your doctor). Only take over-the-counter or prescription medicines as told by your doctor. Avoid smoking and secondhand smoke. These can make bronchitis worse. If you are a smoker, think about using nicotine gum or skin patches. Quitting smoking will help your lungs heal faster. Reduce the chance of getting bronchitis again by: Washing your hands often. Avoiding people with cold symptoms. Trying not to touch your hands to your mouth, nose, or eyes. Follow up with your doctor as told.  GET HELP IF: Your symptoms do not improve after 1 week of treatment. Symptoms include: Cough. Fever. Coughing up thick spit. Body aches. Chest congestion. Chills. Shortness of breath. Sore throat.  GET HELP RIGHT AWAY IF:  You have an increased fever. You have chills. You have severe shortness of breath. You have bloody thick spit (sputum). You throw up (vomit) often. You lose too much body fluid (dehydration). You have a severe headache. You faint.  MAKE SURE YOU:  Understand these instructions. Will watch your condition. Will get help right away if you are not doing well or get worse. Document Released: 11/04/2007 Document Revised: 01/18/2013 Document Reviewed: 11/08/2012 [facility] Patient Information 2015 [place], [place]. This information is not intended to replace advice given to you by your health care provider. Make sure you discuss any questions you have with your health care provider.    If you have  been instructed to have an in-person evaluation today at a local Urgent Care facility, please use the link below. It will take you to a list of all of our available [facility], including address, phone  number and hours of operation. Please do not delay care.  [facility]  If you or a family member do not have a primary care provider, use the link below to schedule a visit and establish care. When you choose a [facility] primary care physician or advanced practice provider, you gain a long-term partner in health. Find a Primary Care Provider  Learn more about [facility] in-office and virtual care options: [facility] - Get Care Now

## 2022-11-05 NOTE — Progress Notes (Signed)
Virtual Visit Consent   Carrie Allen, you are scheduled for a virtual visit with a [facility] provider today. Just as with appointments in the office, your consent must be obtained to participate. Your consent will be active for this visit and any virtual visit you may have with one of our providers in the next 365 days. If you have a MyChart account, a copy of this consent can be sent to you electronically.  As this is a virtual visit, video technology does not allow for your provider to perform a traditional examination. This may limit your provider's ability to fully assess your condition. If your provider identifies any concerns that need to be evaluated in person or the need to arrange testing (such as labs, EKG, etc.), we will make arrangements to do so. Although advances in technology are sophisticated, we cannot ensure that it will always work on either your end or our end. If the connection with a video visit is poor, the visit may have to be switched to a telephone visit. With either a video or telephone visit, we are not always able to ensure that we have a secure connection.  By engaging in this virtual visit, you consent to the provision of healthcare and authorize for your insurance to be billed (if applicable) for the services provided during this visit. Depending on your insurance coverage, you may receive a charge related to this service.  I need to obtain your verbal consent now. Are you willing to proceed with your visit today? Morrissa R Dunleavy has provided verbal consent on 11/05/2022 for a virtual visit (video or telephone). Carrie Allen, [place]  Date: 11/05/2022 6:04 PM  Virtual Visit via Video Note   I, Carrie Allen, connected with  Carrie Allen  ([id number], 03/09/1996) on 11/05/22 at  6:00 PM EDT by a video-enabled telemedicine application and verified that I am speaking with the correct person using two identifiers.  Location: Patient: Virtual Visit  Location Patient: Home Provider: Virtual Visit Location Provider: Home Office   I discussed the limitations of evaluation and management by telemedicine and the availability of in person appointments. The patient expressed understanding and agreed to proceed.    History of Present Illness: Carrie Allen is a 27 y.o. who identifies as a female who was assigned female at birth, and is being seen today for URI symptoms for the past 3 weeks, worsening over the past weekend.  Noted nasal and chest congestion with cough, fatigue. Over weekend with sinus pressure, change in cough to productive and associated with low-grade fever. Notes ear pressure and popping.   HPI: HPI  Problems:  Patient Active Problem List   Diagnosis Date Noted   Nexplanon in place 05/11/2019   Anxiety and depression 11/18/2016   Intrinsic atopic dermatitis 11/18/2016   OSA (obstructive sleep apnea) 10/10/2015   Gastroesophageal reflux disease without esophagitis 10/08/2015   Other seasonal allergic rhinitis 10/03/2015   Eczema 09/16/2015   Morbid obesity due to excess calories (HCC)     Allergies:  Allergies  Allergen Reactions   Nickel    Medications:  Current Outpatient Medications:    amoxicillin-clavulanate (AUGMENTIN) 875-125 MG tablet, Take 1 tablet by mouth 2 (two) times daily., Disp: 14 tablet, Rfl: 0   fluticasone (FLONASE) 50 MCG/ACT nasal spray, Place 2 sprays into both nostrils daily., Disp: 16 g, Rfl: 0   promethazine-dextromethorphan (PROMETHAZINE-DM) 6.25-15 MG/5ML syrup, Take 5 mLs by mouth 4 (four) times daily as needed for  cough., Disp: 118 mL, Rfl: 0  Observations/Objective: Patient is well-developed, well-nourished in no acute distress.  Resting comfortably at home.  Head is normocephalic, atraumatic.  No labored breathing. Speech is clear and coherent with logical content.  Patient is alert and oriented at baseline.   Assessment and Plan: 1. Acute bacterial bronchitis - fluticasone  (FLONASE) 50 MCG/ACT nasal spray; Place 2 sprays into both nostrils daily.  Dispense: 16 g; Refill: 0 - amoxicillin-clavulanate (AUGMENTIN) 875-125 MG tablet; Take 1 tablet by mouth 2 (two) times daily.  Dispense: 14 tablet; Refill: 0 - promethazine-dextromethorphan (PROMETHAZINE-DM) 6.25-15 MG/5ML syrup; Take 5 mLs by mouth 4 (four) times daily as needed for cough.  Dispense: 118 mL; Refill: 0  Rx Augmentin.  Increase fluids.  Rest.  Saline nasal spray.  Probiotic.  Mucinex as directed.  Humidifier in bedroom. Flonase and promethazine-DM per orders.  Call or return to clinic if symptoms are not improving.   Follow Up Instructions: I discussed the assessment and treatment plan with the patient. The patient was provided an opportunity to ask questions and all were answered. The patient agreed with the plan and demonstrated an understanding of the instructions.  A copy of instructions were sent to the patient via MyChart unless otherwise noted below.   The patient was advised to call back or seek an in-person evaluation if the symptoms worsen or if the condition fails to improve as anticipated.  Time:  I spent 10 minutes with the patient via telehealth technology discussing the above problems/concerns.    Carrie Climes, PA-C

## 2022-11-06 ENCOUNTER — Encounter: Payer: Self-pay | Admitting: Family Medicine

## 2022-11-06 ENCOUNTER — Telehealth: Payer: Medicaid Other | Admitting: Family Medicine

## 2022-11-06 DIAGNOSIS — B9689 Other specified bacterial agents as the cause of diseases classified elsewhere: Secondary | ICD-10-CM

## 2022-11-06 NOTE — Progress Notes (Signed)
[place]  Needs a work note from yesterdays visit.

## 2023-10-13 ENCOUNTER — Telehealth

## 2023-10-14 ENCOUNTER — Telehealth (INDEPENDENT_AMBULATORY_CARE_PROVIDER_SITE_OTHER): Admitting: Nurse Practitioner

## 2023-10-14 ENCOUNTER — Encounter: Payer: Self-pay | Admitting: Nurse Practitioner

## 2023-10-14 VITALS — BP 122/80 | HR 95 | Resp 16 | Wt 329.8 lb

## 2023-10-14 DIAGNOSIS — Z0000 Encounter for general adult medical examination without abnormal findings: Secondary | ICD-10-CM

## 2023-10-14 DIAGNOSIS — Z8669 Personal history of other diseases of the nervous system and sense organs: Secondary | ICD-10-CM

## 2023-10-14 DIAGNOSIS — Z131 Encounter for screening for diabetes mellitus: Secondary | ICD-10-CM

## 2023-10-14 DIAGNOSIS — Z1321 Encounter for screening for nutritional disorder: Secondary | ICD-10-CM

## 2023-10-14 DIAGNOSIS — F1321 Sedative, hypnotic or anxiolytic dependence, in remission: Secondary | ICD-10-CM | POA: Diagnosis not present

## 2023-10-14 DIAGNOSIS — F4321 Adjustment disorder with depressed mood: Secondary | ICD-10-CM

## 2023-10-14 NOTE — Progress Notes (Signed)
 New Patient Office Visit     Mardene Shake, FNP  Date: 10/14/2023 9:43 AM  SUBJECTIVE    Patient ID: Carrie Allen, female    DOB: 1995-12-14  Age: 28 y.o. MRN: [id number]   The provider is on site for this visit.   Location:  Patient: [facility] Provider: Virtual Visit Location Provider: Office/Clinic     CC:  Chief Complaint  Patient presents with   Establish Care   Obesity    HPI Carrie Allen is a 28 y.o. who identifies as a female who was assigned female at birth, and is being seen today to establish care with [facility] for PCP services and for consultation on weight loss. She has tried diet in the past for weight loss and was successful in losing 20lbs. She recently attending a consultation with the bariatric center, but she prefers to avoid surgery at this time if possible.   Most recent labs were 2023  Family history if consistent with: Mother has Type 2 DM No known family history of cancer   She has dealt with grief in the past year, she  lost grandmother one year ago   Has been off OCP since 2021 and has had STI testing since her most recent partner, denies any concern today   History of sleep apnea, has had testing in the past, no longer has her CPAP, she is aware of snoring and states that her friends tell her all the time that she snores loudly. She also has had daytime sleepiness and will snore loudly and be "out" in the middle of the day or when riding in a car.  Most recent labs with evidence of anemia (05/27/2022) Denies any known history of elevated blood glucose or kidney disease   She denies any other significant medical history    Past Medical History:  Diagnosis Date   Adjustment disorder with mixed anxiety and depressed mood 04/03/2015   Allergic conjunctivitis 10/03/2015   Knee pain, acute 02/13/2016   Obesity    Sleep apnea    URI, acute 02/24/2017    History reviewed. No pertinent surgical history.  Family History  Problem  Relation Age of Onset   Autism Brother     Social History   Socioeconomic History   Marital status: Single    Spouse name: Not on file   Number of children: Not on file   Years of education: Not on file   Highest education level: GED or equivalent  Occupational History   Not on file  Tobacco Use   Smoking status: Some Days    Current packs/day: 0.25    Types: Cigarettes   Smokeless tobacco: Never   Tobacco comments:    she smokes irreg and GF smokes outdoors  Substance and Sexual Activity   Alcohol use: Yes    Alcohol/week: 1.0 standard drink of alcohol    Types: 1 Shots of liquor per week    Comment: socially   Drug use: Yes    Types: Marijuana, Cocaine    Comment: last year- cocaine  marijuana- a couple of days ago   Sexual activity: Yes    Birth control/protection: Implant  Other Topics Concern   Not on file  Social History Narrative   Not on file   Social Drivers of Health   Financial Resource Strain: High Risk (10/13/2023)   Overall Financial Resource Strain (CARDIA)    Difficulty of Paying Living Expenses: Very hard  Food Insecurity: No Food Insecurity (10/13/2023)  Hunger Vital Sign    Worried About Running Out of Food in the Last Year: Never true    Ran Out of Food in the Last Year: Never true  Transportation Needs: No Transportation Needs (10/13/2023)   PRAPARE - Administrator, Civil Service (Medical): No    Lack of Transportation (Non-Medical): No  Physical Activity: Unknown (10/13/2023)   Exercise Vital Sign    Days of Exercise per Week: 0 days    Minutes of Exercise per Session: Not on file  Stress: Stress Concern Present (10/13/2023)   [employer] of Occupational Health - Occupational Stress Questionnaire    Feeling of Stress : To some extent  Social Connections: Socially Isolated (10/13/2023)   Social Connection and Isolation Panel [NHANES]    Frequency of Communication with Friends and Family: More than three times a week     Frequency of Social Gatherings with Friends and Family: Once a week    Attends Religious Services: Never    Database administrator or Organizations: No    Attends Engineer, structural: Not on file    Marital Status: Never married  Intimate Partner Violence: Not on file    Review of Systems  Constitutional: Negative.   HENT: Negative.    Eyes: Negative.   Respiratory: Negative.    Cardiovascular: Negative.   Gastrointestinal: Negative.   Genitourinary: Negative.   Musculoskeletal: Negative.   Skin: Negative.   Neurological: Negative.   Psychiatric/Behavioral: Negative.          OBJECTIVE    Today's Vitals   10/14/23 0931  BP: 122/80  Pulse: 95  Resp: 16  SpO2: 98%  Weight: (!) 329 lb 12.8 oz (149.6 kg)  PainSc: 0-No pain   Body mass index is 51.65 kg/m.   Physical Exam Constitutional:      General: She is not in acute distress.    Appearance: She is obese.  HENT:     Head: Normocephalic.     Nose: Nose normal.     Mouth/Throat:     Mouth: Mucous membranes are moist.  Eyes:     Extraocular Movements: Extraocular movements intact.  Neck:     Thyroid: No thyroid mass or thyroid tenderness.  Cardiovascular:     Rate and Rhythm: Normal rate and regular rhythm.     Heart sounds: Normal heart sounds.  Pulmonary:     Effort: Pulmonary effort is normal.     Breath sounds: Normal breath sounds.  Abdominal:     Palpations: Abdomen is soft.  Musculoskeletal:        General: Normal range of motion.     Cervical back: Normal range of motion. No tenderness.  Skin:    General: Skin is warm.  Neurological:     General: No focal deficit present.     Mental Status: She is alert and oriented to person, place, and time.  Psychiatric:        Mood and Affect: Mood normal.         Assessment & Plan:   Plan to follow up with lab results tomorrow to discuss next steps in weight loss. Patient is interested in GLP-1    1. Encounter for vitamin deficiency  screening (Primary)  - VITAMIN D 25 Hydroxy (Vit-D Deficiency, Fractures) - Vitamin B12  2. Screening for diabetes mellitus (DM)  3. History of sleep apnea  - Home sleep test  4. Morbid obesity (HCC)  - CBC with Differential/Platelet - Comprehensive metabolic  panel with GFR - Hemoglobin A1c - Lipid panel - TSH - Ferritin - Home sleep test - Hepatitis C antibody  5. Grief  - AMB Referral [facility] Care Management      Follow Up Instructions: I discussed the assessment and treatment plan with the patient. The patient was provided an opportunity to ask questions and all were answered. The patient agreed with the plan and demonstrated an understanding of the instructions.  A copy of instructions were sent to the patient via MyChart unless otherwise noted below.    The patient was advised to call back or seek an in-person evaluation if the symptoms worsen or if the condition fails to improve as anticipated.    Mardene Shake, FNP  **Disclaimer: This note may have been dictated with voice recognition software. Similar sounding words can inadvertently be transcribed and this note may contain transcription errors which may not have been corrected upon publication of note.**

## 2023-10-14 NOTE — Progress Notes (Signed)
 Pt presents to establish care -discuss weight loss options -request referral to gyn

## 2023-10-14 NOTE — Progress Notes (Signed)
 The patient presented for a primary care visit on 10/14/23 to establish care. Blood pressure screening was conducted, and the result was 122/80. During the appointment, the patient reported having a housing and transportation SDOH insecurity. As a precaution, the patient was provided with transportation, utility, and housing SDOH resources.  A review of the patient's chart revealed that they do have a primary care provider (PCP) but have not seen them within the last twelve months. No future appointments were indicated at this time. An additional follow up will be done according to the health equity team's protocol.

## 2023-10-18 ENCOUNTER — Telehealth: Payer: Self-pay | Admitting: Nurse Practitioner

## 2023-10-18 ENCOUNTER — Encounter: Payer: Self-pay | Admitting: Nurse Practitioner

## 2023-10-18 ENCOUNTER — Other Ambulatory Visit: Payer: Self-pay | Admitting: Nurse Practitioner

## 2023-10-18 ENCOUNTER — Other Ambulatory Visit: Payer: Self-pay

## 2023-10-18 DIAGNOSIS — E559 Vitamin D deficiency, unspecified: Secondary | ICD-10-CM

## 2023-10-18 MED ORDER — ERGOCALCIFEROL 1.25 MG (50000 UT) PO CAPS
50000.0000 [IU] | ORAL_CAPSULE | ORAL | 0 refills | Status: AC
  Filled 2023-10-18: qty 8, 56d supply, fill #0

## 2023-10-18 MED ORDER — WEGOVY 0.25 MG/0.5ML ~~LOC~~ SOAJ
0.2500 mg | SUBCUTANEOUS | 0 refills | Status: DC
  Filled 2023-10-18: qty 2, 28d supply, fill #0

## 2023-10-18 NOTE — Progress Notes (Signed)
 Labs are not viewable in EPIC at the time, faxed from [facility]   Verified  Bun 8 Creatinine 0.65 eGFR 123 Bun/Creatinine ratio 12 (All WNL)   Vitamin D  8.2  TSH 1.060  Hemoglobin A1C 5.4    Morbid obesity (HCC) - Plan: Semaglutide -Weight Management (WEGOVY ) 0.25 MG/0.5ML SOAJ, AMB Referral [facility]BCI Care Management  Vitamin D  deficiency - Plan: ergocalciferol  (VITAMIN D2) 1.25 MG (50000 UT) capsule  Meds ordered this encounter  Medications   Semaglutide -Weight Management (WEGOVY ) 0.25 MG/0.5ML SOAJ    Sig: Inject 0.25 mg into the skin once a week for 4 doses.    Dispense:  2 mL    Refill:  0   ergocalciferol  (VITAMIN D2) 1.25 MG (50000 UT) capsule    Sig: Take 1 capsule (50,000 Units total) by mouth once a week for 8 doses.    Dispense:  8 capsule    Refill:  0     Will send Mychart message to patient to further discuss starting Wegovy  for weight loss goals. Also to discuss Vitamin D  supplements.   Return to office 4 weeks after starting Wegovy  for check up. Earlier with any concerns   Pharmacy referral for medication assistance with new injectable   Repeat CMP add B12 and iron in 4 weeks  Recheck vitamin d  in 12 weeks

## 2023-10-18 NOTE — Telephone Encounter (Signed)
 Will send Mychart message to patient with lab results

## 2023-10-19 ENCOUNTER — Encounter: Payer: Self-pay | Admitting: Nurse Practitioner

## 2023-10-22 ENCOUNTER — Other Ambulatory Visit: Payer: Self-pay | Admitting: Nurse Practitioner

## 2023-10-22 ENCOUNTER — Encounter: Payer: Self-pay | Admitting: Nurse Practitioner

## 2023-10-22 DIAGNOSIS — D509 Iron deficiency anemia, unspecified: Secondary | ICD-10-CM

## 2023-10-22 LAB — COMPREHENSIVE METABOLIC PANEL WITH GFR
ALT: 11 IU/L (ref 0–32)
AST: 16 IU/L (ref 0–40)
Albumin: 4.5 g/dL (ref 4.0–5.0)
Alkaline Phosphatase: 105 IU/L (ref 44–121)
BUN/Creatinine Ratio: 12 (ref 9–23)
BUN: 8 mg/dL (ref 6–20)
Bilirubin Total: 0.2 mg/dL (ref 0.0–1.2)
CO2: 19 mmol/L — ABNORMAL LOW (ref 20–29)
Calcium: 9.8 mg/dL (ref 8.7–10.2)
Chloride: 102 mmol/L (ref 96–106)
Creatinine, Ser: 0.65 mg/dL (ref 0.57–1.00)
Globulin, Total: 2.9 g/dL (ref 1.5–4.5)
Glucose: 103 mg/dL — ABNORMAL HIGH (ref 70–99)
Potassium: 4.9 mmol/L (ref 3.5–5.2)
Sodium: 141 mmol/L (ref 134–144)
Total Protein: 7.4 g/dL (ref 6.0–8.5)
eGFR: 123 mL/min/{1.73_m2} (ref >59)

## 2023-10-22 LAB — VITAMIN D 25 HYDROXY (VIT D DEFICIENCY, FRACTURES): Vit D, 25-Hydroxy: 8.2 ng/mL — ABNORMAL LOW (ref 30.0–100.0)

## 2023-10-22 LAB — LIPID PANEL
Chol/HDL Ratio: 3 ratio (ref 0.0–4.4)
Cholesterol, Total: 172 mg/dL (ref 100–199)
HDL: 58 mg/dL (ref >39)
LDL Chol Calc (NIH): 101 mg/dL — ABNORMAL HIGH (ref 0–99)
Triglycerides: 69 mg/dL (ref 0–149)
VLDL Cholesterol Cal: 13 mg/dL (ref 5–40)

## 2023-10-22 LAB — CBC WITH DIFFERENTIAL/PLATELET
Basophils Absolute: 0 10*3/uL (ref 0.0–0.2)
Basos: 0 %
EOS (ABSOLUTE): 0.1 10*3/uL (ref 0.0–0.4)
Eos: 1 %
Hematocrit: 41.8 % (ref 34.0–46.6)
Hemoglobin: 12.5 g/dL (ref 11.1–15.9)
Immature Grans (Abs): 0 10*3/uL (ref 0.0–0.1)
Immature Granulocytes: 0 %
Lymphocytes Absolute: 2 10*3/uL (ref 0.7–3.1)
Lymphs: 18 %
MCH: 25.4 pg — ABNORMAL LOW (ref 26.6–33.0)
MCHC: 29.9 g/dL — ABNORMAL LOW (ref 31.5–35.7)
MCV: 85 fL (ref 79–97)
Monocytes Absolute: 0.5 10*3/uL (ref 0.1–0.9)
Monocytes: 5 %
Neutrophils Absolute: 8.4 10*3/uL — ABNORMAL HIGH (ref 1.4–7.0)
Neutrophils: 76 %
Platelets: 278 10*3/uL (ref 150–450)
RBC: 4.93 x10E6/uL (ref 3.77–5.28)
RDW: 15.2 % (ref 11.7–15.4)
WBC: 11 10*3/uL — ABNORMAL HIGH (ref 3.4–10.8)

## 2023-10-22 LAB — HEPATITIS C ANTIBODY: Hep C Virus Ab: NONREACTIVE

## 2023-10-22 LAB — HEMOGLOBIN A1C
Est. average glucose Bld gHb Est-mCnc: 108 mg/dL
Hgb A1c MFr Bld: 5.4 % (ref 4.8–5.6)

## 2023-10-22 LAB — VITAMIN B12

## 2023-10-22 LAB — TSH: TSH: 1.06 u[IU]/mL (ref 0.450–4.500)

## 2023-10-22 LAB — FERRITIN: Ferritin: 10 ng/mL — ABNORMAL LOW

## 2023-10-22 MED ORDER — IRON (FERROUS SULFATE) 325 (65 FE) MG PO TABS: 325.0000 mg | ORAL_TABLET | Freq: Every day | ORAL | 3 refills | Status: DC

## 2023-10-27 NOTE — Telephone Encounter (Signed)
 Completed.

## 2023-11-15 ENCOUNTER — Telehealth: Admitting: Nurse Practitioner

## 2023-11-15 ENCOUNTER — Other Ambulatory Visit: Payer: Self-pay

## 2023-11-15 VITALS — BP 115/78 | HR 87 | Resp 16 | Ht 67.0 in | Wt 331.0 lb

## 2023-11-15 DIAGNOSIS — Z5181 Encounter for therapeutic drug level monitoring: Secondary | ICD-10-CM | POA: Diagnosis not present

## 2023-11-15 DIAGNOSIS — E66813 Obesity, class 3: Secondary | ICD-10-CM | POA: Diagnosis not present

## 2023-11-15 DIAGNOSIS — Z6841 Body Mass Index (BMI) 40.0 and over, adult: Secondary | ICD-10-CM

## 2023-11-15 MED ORDER — WEGOVY 0.5 MG/0.5ML ~~LOC~~ SOAJ
0.5000 mg | SUBCUTANEOUS | 0 refills | Status: DC
  Filled 2023-11-15: qty 2, 28d supply, fill #0

## 2023-11-15 NOTE — Progress Notes (Unsigned)
 Established Patient Office Visit  Virtual Visit Consent:   Carrie Allen, you are scheduled for a virtual visit with a [facility] provider today.     Just as with appointments in the office, your consent must be obtained to participate.  Your consent will be active for this visit and any virtual visit you may have with one of our providers in the next 365 days.     If you have a MyChart account, a copy of this consent can be sent to you electronically.  All virtual visits are billed to your insurance company just like a traditional visit in the office.    As this is a virtual visit, video technology does not allow for your provider to perform a traditional examination.  This may limit your provider's ability to fully assess your condition.  If your provider identifies any concerns that need to be evaluated in person or the need to arrange testing (such as labs, EKG, etc.), we will make arrangements to do so.     Although advances in technology are sophisticated, we cannot ensure that it will always work on either your end or our end.  If the connection with a video visit is poor, the visit may have to be switched to a telephone visit.  With either a video or telephone visit, we are not always able to ensure that we have a secure connection.     I need to obtain your verbal consent now.   Are you willing to proceed with your visit today?    Carrie Allen has provided verbal consent on 11/15/2023 for a virtual visit (video or telephone).   Carrie Shake, FNP  Date: 11/15/2023 3:49 PM  SUBJECTIVE  Patient ID: Carrie Allen, female    DOB: [birth year]  Age: 28 y.o. MRN: [id number]  Carrie Allen, connected with  Carrie Allen  ([id number], July 31, 1995) on 11/15/23 at  3:40 PM EDT by a video-enabled telemedicine application and verified that I am speaking with the correct person using two identifiers.  Telepresenter, Marguerite Shiley, present for entirety of visit to assist with video  functionality and physical examination via [employer] device.   Location: Patient: [facility] Provider: Virtual Visit Location Provider: Home   I discussed the limitations of evaluation and management by telemedicine and the availability of in person appointments. The patient expressed understanding and agreed to proceed.      Chief Complaint  Patient presents with   Weight Check    HPI  Carrie Allen is a 28 y.o. who identifies as a female who was assigned female at birth, and is being seen today for follow up on Wegovy  She has taken her 4 shots so far.  She did have nausea the first week with increased BM but feels that those symptoms have improved   She is able to eat without difficulty, but has decreased her meals to two per day.  She is making good efforts to stay hydrated   Denies any mood changes   Flowsheet Row Telemedicine from 11/15/2023 in [facility]  PHQ-2 Total Score 0     {History (Optional):23778}  ROS    Objective:     BP 115/78 (BP Location: Left Arm, Patient Position: Sitting, Cuff Size: Large)   Pulse 87   Resp 16   Ht 5' 7 (1.702 m)   Wt (!) 331 lb (150.1 kg)   SpO2 95%   BMI 51.84 kg/m  {Vitals  History (Optional):23777}  Physical Exam   No results found for any visits on 11/15/23.  {Labs (Optional):23779}  The ASCVD Risk score (Arnett DK, et al., 2019) failed to calculate for the following reasons:   The 2019 ASCVD risk score is only valid for ages 15 to 39    Assessment & Plan:   Problem List Items Addressed This Visit   None Visit Diagnoses       Therapeutic drug monitoring    -  Primary   Relevant Orders   Comprehensive metabolic panel with GFR     Class 3 severe obesity without serious comorbidity with body mass index (BMI) of 50.0 to 59.9 in adult, unspecified obesity type       Relevant Medications   Semaglutide -Weight Management (WEGOVY ) 0.5 MG/0.5ML SOAJ       No follow-ups on file.    Follow Up Instructions:  I discussed the assessment and treatment plan with the patient. The patient was provided an opportunity to ask questions and all were answered. The patient agreed with the plan and demonstrated an understanding of the instructions.  A copy of instructions were sent to the patient via MyChart unless otherwise noted below.     The patient was advised to call back or seek an in-person evaluation if the symptoms worsen or if the condition fails to improve as anticipated.   Carrie Shake, FNP  **Disclaimer: This note may have been dictated with voice recognition software. Similar sounding words can inadvertently be transcribed and this note may contain transcription errors which may not have been corrected upon publication of note.**

## 2023-11-15 NOTE — Patient Instructions (Signed)
 Return in 4 weeks

## 2023-11-15 NOTE — Progress Notes (Unsigned)
 Pt presents for weight check  -no issues with Wegovy 

## 2023-11-16 ENCOUNTER — Encounter: Payer: Self-pay | Admitting: Nurse Practitioner

## 2023-11-16 LAB — COMPREHENSIVE METABOLIC PANEL WITH GFR
ALT: 17 IU/L (ref 0–32)
AST: 12 IU/L (ref 0–40)
Albumin: 4.3 g/dL (ref 4.0–5.0)
Alkaline Phosphatase: 92 IU/L (ref 44–121)
BUN/Creatinine Ratio: 17 (ref 9–23)
BUN: 11 mg/dL (ref 6–20)
Bilirubin Total: <0.2 mg/dL (ref 0.0–1.2)
CO2: 24 mmol/L (ref 20–29)
Calcium: 9.5 mg/dL (ref 8.7–10.2)
Chloride: 101 mmol/L (ref 96–106)
Creatinine, Ser: 0.64 mg/dL (ref 0.57–1.00)
Globulin, Total: 2.8 g/dL (ref 1.5–4.5)
Glucose: 121 mg/dL — ABNORMAL HIGH (ref 70–99)
Potassium: 4.3 mmol/L (ref 3.5–5.2)
Sodium: 140 mmol/L (ref 134–144)
Total Protein: 7.1 g/dL (ref 6.0–8.5)
eGFR: 123 mL/min/{1.73_m2} (ref >59)

## 2023-12-06 ENCOUNTER — Encounter: Payer: Self-pay | Admitting: Nurse Practitioner

## 2023-12-10 ENCOUNTER — Telehealth: Payer: Self-pay

## 2023-12-10 NOTE — Telephone Encounter (Signed)
 1:38pm Pt lvm requesting that appt for 7/14 be completed as a MyChart visit. Pt stated that even though she works from home she does have much time on her lunch break to make it to her appt.   2:27 pm: Att to contact pt to advise that provider has approved for her appt to be completed by MyChart no ans lvm   Patient is currently being treated with Wegovy ; in-office appointments are required to properly monitor and manage the medication titration process.

## 2023-12-13 ENCOUNTER — Other Ambulatory Visit: Payer: Self-pay

## 2023-12-13 ENCOUNTER — Telehealth: Admitting: Nurse Practitioner

## 2023-12-13 ENCOUNTER — Ambulatory Visit: Admitting: Nurse Practitioner

## 2023-12-13 DIAGNOSIS — E66813 Obesity, class 3: Secondary | ICD-10-CM | POA: Diagnosis not present

## 2023-12-13 DIAGNOSIS — Z6841 Body Mass Index (BMI) 40.0 and over, adult: Secondary | ICD-10-CM

## 2023-12-13 MED ORDER — WEGOVY 1 MG/0.5ML ~~LOC~~ SOAJ
1.0000 mg | SUBCUTANEOUS | 0 refills | Status: DC
  Filled 2023-12-13: qty 2, 28d supply, fill #0

## 2023-12-13 NOTE — Progress Notes (Signed)
 Virtual Visit Consent   Carrie Allen, you are scheduled for a virtual visit with a [facility] provider today. Just as with appointments in the office, your consent must be obtained to participate. Your consent will be active for this visit and any virtual visit you may have with one of our providers in the next 365 days. If you have a MyChart account, a copy of this consent can be sent to you electronically.  As this is a virtual visit, video technology does not allow for your provider to perform a traditional examination. This may limit your provider's ability to fully assess your condition. If your provider identifies any concerns that need to be evaluated in person or the need to arrange testing (such as labs, EKG, etc.), we will make arrangements to do so. Although advances in technology are sophisticated, we cannot ensure that it will always work on either your end or our end. If the connection with a video visit is poor, the visit may have to be switched to a telephone visit. With either a video or telephone visit, we are not always able to ensure that we have a secure connection.  By engaging in this virtual visit, you consent to the provision of healthcare and authorize for your insurance to be billed (if applicable) for the services provided during this visit. Depending on your insurance coverage, you may receive a charge related to this service.  I need to obtain your verbal consent now. Are you willing to proceed with your visit today? Carrie Allen has provided verbal consent on 12/13/2023 for a virtual visit (video or telephone). Carrie Kitty, FNP  Date: 12/13/2023 12:54 PM   Virtual Visit via Video Note   I, Carrie Allen, connected with  Carrie Allen  ([id number], 10/02/1995) on 12/13/23 at  1:00 PM EDT by a video-enabled telemedicine application and verified that I am speaking with the correct person using two identifiers.  Location: Patient: Virtual Visit Location Patient:  Home Provider: Virtual Visit Location Provider: Home Office   I discussed the limitations of evaluation and management by telemedicine and the availability of in person appointments. The patient expressed understanding and agreed to proceed.    History of Present Illness: Carrie Allen is a 28 y.o. who identifies as a female who was assigned female at birth, and is being seen today for follow up.   She started Wegovy  8 weeks ago. She has tolerated the medication so far. Denies side effects  She continues to have a decent appetite. Denies diarrhea or daily nausea   She denies any difficulty performing self injections, she typically alternates sides of her abdomen for injection and denies any redness or swelling at injection sites.   Denies any chances of pregnancy   She is monitoring her weight at times but not on a consistent scale and has not weighed in since her last clinic appointment. She feels she is losing weight and states that others have noticed.       12/13/2023   12:58 PM 11/15/2023    3:41 PM 10/14/2023    9:47 AM  PHQ9 SCORE ONLY  PHQ-9 Total Score 1 0 0      Problems:  Patient Active Problem List   Diagnosis Date Noted   Nexplanon  in place 05/11/2019   Anxiety and depression 11/18/2016   Intrinsic atopic dermatitis 11/18/2016   OSA (obstructive sleep apnea) 10/10/2015   Gastroesophageal reflux disease without esophagitis 10/08/2015   Other seasonal allergic rhinitis  10/03/2015   Eczema 09/16/2015   Morbid obesity due to excess calories (HCC)     Allergies:  Allergies  Allergen Reactions   Nickel    Medications:  Current Outpatient Medications:    ergocalciferol  (VITAMIN D2) 1.25 MG (50000 UT) capsule, Take 1 capsule (50,000 Units total) by mouth once a week for 8 doses., Disp: 8 capsule, Rfl: 0   Iron , Ferrous Sulfate , 325 (65 Fe) MG TABS, Take 325 mg by mouth daily., Disp: 30 tablet, Rfl: 3   Semaglutide -Weight Management (WEGOVY ) 0.5 MG/0.5ML SOAJ,  Inject 0.5 mg into the skin once a week for 4 doses., Disp: 2 mL, Rfl: 0  Observations/Objective: Patient is well-developed, well-nourished in no acute distress.  Resting comfortably  at home.  Head is normocephalic, atraumatic.  No labored breathing.  Speech is clear and coherent with logical content.  Patient is alert and oriented at baseline.    Assessment and Plan:  1. Class 3 severe obesity without serious comorbidity with body mass index (BMI) of 50.0 to 59.9 in adult, unspecified obesity type   Meds ordered this encounter  Medications   Semaglutide -Weight Management (WEGOVY ) 1 MG/0.5ML SOAJ    Sig: Inject 1 mg into the skin once a week for 4 doses.    Dispense:  2 mL    Refill:  0    Follow up in 4 weeks to evaluate tolerance of medication, earlier with any concerns or SE  Continue efforts to eat healthy/balanced diet and exercise   Continuing education sent to patient today  Will need in person in 2 months for weight and labs   Follow Up Instructions: I discussed the assessment and treatment plan with the patient. The patient was provided an opportunity to ask questions and all were answered. The patient agreed with the plan and demonstrated an understanding of the instructions.  A copy of instructions were sent to the patient via MyChart unless otherwise noted below.    The patient was advised to call back or seek an in-person evaluation if the symptoms worsen or if the condition fails to improve as anticipated.    Carrie Kitty, FNP

## 2023-12-22 ENCOUNTER — Other Ambulatory Visit: Payer: Self-pay

## 2024-01-03 ENCOUNTER — Telehealth: Admitting: Nurse Practitioner

## 2024-01-06 NOTE — Progress Notes (Unsigned)
 The patient presented for a primary care visit on 10/14/23 to establish care. Blood pressure screening was conducted, and the result was 122/80. During the appointment, the patient reported having a housing and transportation SDOH insecurity. As a precaution, the patient was provided with transportation, utility, and housing SDOH resources by [employer].  A review of the patient's chart revealed that they do currently have a primary care provider Lauraine Kitty, and a future pcp appointment on 01/17/24. Spoke with pt and she stated that housing was her main concern at   An additional follow up will be done according to the health equity team's protocol.  See if its ok to send obesity resources, like healthy eating resources etc. Call to see if she still has housing utility and transportation sdoh, if so get permission do do a find help referral. Send smoking cessation [phone] Left a vm for a call back, call back at the end of the day and again tomorrow.pt returned call and stated that her main sdoh is housing. Pt gave permission for find help referral to be done.

## 2024-01-08 ENCOUNTER — Telehealth: Admitting: Physician Assistant

## 2024-01-08 DIAGNOSIS — B3731 Acute candidiasis of vulva and vagina: Secondary | ICD-10-CM

## 2024-01-08 MED ORDER — FLUCONAZOLE 150 MG PO TABS: ORAL_TABLET | ORAL | 0 refills | Status: DC

## 2024-01-08 NOTE — Patient Instructions (Signed)
 Lorel JONELLE Bars, thank you for joining Elsie Velma Lunger, PA-C for today's virtual visit.  While this provider is not your primary care provider (PCP), if your PCP is located in our provider database this encounter information will be shared with them immediately following your visit.   A [facility] MyChart account gives you access to today's visit and all your visits, tests, and labs performed at [facility]  click here if you don't have a [facility] MyChart account or go to [email or web address]  Consent: (Patient) Verity R Allensworth provided verbal consent for this virtual visit at the beginning of the encounter.  Current Medications:  Current Outpatient Medications:    Iron , Ferrous Sulfate , 325 (65 Fe) MG TABS, Take 325 mg by mouth daily., Disp: 30 tablet, Rfl: 3   Semaglutide -Weight Management (WEGOVY ) 1 MG/0.5ML SOAJ, Inject 1 mg into the skin once a week for 4 doses., Disp: 2 mL, Rfl: 0   Medications ordered in this encounter:  No orders of the defined types were placed in this encounter.    *If you need refills on other medications prior to your next appointment, please contact your pharmacy*  Follow-Up: Call back or seek an in-person evaluation if the symptoms worsen or if the condition fails to improve as anticipated.  [facility] [phone]  Other Instructions Vaginal Yeast Infection, Adult  Vaginal yeast infection is a condition that causes vaginal discharge as well as soreness, swelling, and redness (inflammation) of the vagina. This is a common condition. Some women get this infection frequently. What are the causes? This condition is caused by a change in the normal balance of the yeast (Candida) and normal bacteria that live in the vagina. This change causes an overgrowth of yeast, which causes the inflammation. What increases the risk? The condition is more likely to develop in women who: Take antibiotic medicines. Have  diabetes. Take birth control pills. Are pregnant. Douche often. Have a weak body defense system (immune system). Have been taking steroid medicines for a long time. Frequently wear tight clothing. What are the signs or symptoms? Symptoms of this condition include: White, thick, creamy vaginal discharge. Swelling, itching, redness, and irritation of the vagina. The lips of the vagina (labia) may be affected as well. Pain or a burning feeling while urinating. Pain during sex. How is this diagnosed? This condition is diagnosed based on: Your medical history. A physical exam. A pelvic exam. Your health care provider will examine a sample of your vaginal discharge under a microscope. Your health care provider may send this sample for testing to confirm the diagnosis. How is this treated? This condition is treated with medicine. Medicines may be over-the-counter or prescription. You may be told to use one or more of the following: Medicine that is taken by mouth (orally). Medicine that is applied as a cream (topically). Medicine that is inserted directly into the vagina (suppository). Follow these instructions at home: Take or apply over-the-counter and prescription medicines only as told by your health care provider. Do not use tampons until your health care provider approves. Do not have sex until your infection has cleared. Sex can prolong or worsen your symptoms of infection. Ask your health care provider when it is safe to resume sexual activity. Keep all follow-up visits. This is important. How is this prevented?  Do not wear tight clothes, such as pantyhose or tight pants. Wear breathable cotton underwear. Do not use douches, perfumed soap, creams, or powders. Wipe from front to  back after using the toilet. If you have diabetes, keep your blood sugar levels under control. Ask your health care provider for other ways to prevent yeast infections. Contact a health care provider  if: You have a fever. Your symptoms go away and then return. Your symptoms do not get better with treatment. Your symptoms get worse. You have new symptoms. You develop blisters in or around your vagina. You have blood coming from your vagina and it is not your menstrual period. You develop pain in your abdomen. Summary Vaginal yeast infection is a condition that causes discharge as well as soreness, swelling, and redness (inflammation) of the vagina. This condition is treated with medicine. Medicines may be over-the-counter or prescription. Take or apply over-the-counter and prescription medicines only as told by your health care provider. Do not douche. Resume sexual activity or use of tampons as instructed by your health care provider. Contact a health care provider if your symptoms do not get better with treatment or your symptoms go away and then return. This information is not intended to replace advice given to you by your health care provider. Make sure you discuss any questions you have with your health care provider. Document Revised: 08/05/2020 Document Reviewed: 08/05/2020 Elsevier Patient Education  2024 Elsevier Inc.   If you have been instructed to have an in-person evaluation today at a local Urgent Care facility, please use the link below. It will take you to a list of all of our available [facility], including address, phone number and hours of operation. Please do not delay care.  [facility]  If you or a family member do not have a primary care provider, use the link below to schedule a visit and establish care. When you choose a [facility] primary care physician or advanced practice provider, you gain a long-term partner in health. Find a Primary Care Provider  Learn more about [facility]'s in-office and virtual care options: [facility] - Get Care Now

## 2024-01-08 NOTE — Progress Notes (Signed)
 Virtual Visit Consent   Carrie Allen, you are scheduled for a virtual visit with a [facility] provider today. Just as with appointments in the office, your consent must be obtained to participate. Your consent will be active for this visit and any virtual visit you may have with one of our providers in the next 365 days. If you have a MyChart account, a copy of this consent can be sent to you electronically.  As this is a virtual visit, video technology does not allow for your provider to perform a traditional examination. This may limit your provider's ability to fully assess your condition. If your provider identifies any concerns that need to be evaluated in person or the need to arrange testing (such as labs, EKG, etc.), we will make arrangements to do so. Although advances in technology are sophisticated, we cannot ensure that it will always work on either your end or our end. If the connection with a video visit is poor, the visit may have to be switched to a telephone visit. With either a video or telephone visit, we are not always able to ensure that we have a secure connection.  By engaging in this virtual visit, you consent to the provision of healthcare and authorize for your insurance to be billed (if applicable) for the services provided during this visit. Depending on your insurance coverage, you may receive a charge related to this service.  I need to obtain your verbal consent now. Are you willing to proceed with your visit today? Carrie Allen has provided verbal consent on 01/08/2024 for a virtual visit (video or telephone). Carrie Allen, [place]  Date: 01/08/2024 11:48 AM   Virtual Visit via Video Note   I, Carrie Allen, connected with  Carrie Allen  ([id number], 1995/11/24) on 01/08/24 at 11:45 AM EDT by a video-enabled telemedicine application and verified that I am speaking with the correct person using two identifiers.  Location: Patient: Virtual Visit  Location Patient: Home Provider: Virtual Visit Location Provider: Home Office   I discussed the limitations of evaluation and management by telemedicine and the availability of in person appointments. The patient expressed understanding and agreed to proceed.    History of Present Illness: Carrie Allen is a 28 y.o. who identifies as a female who was assigned female at birth, and is being seen today for possible yeast infection. Endorses symptoms starting yesterday with irritation and mild itching. Notes some mild irritation with change in her normal discharge to thicker/clumpy. Is sexually active with one partner only (female). Denies concern for STI. LMP ended within past 3 weeks.   HPI: HPI  Problems:  Patient Active Problem List   Diagnosis Date Noted   Nexplanon  in place 05/11/2019   Anxiety and depression 11/18/2016   Intrinsic atopic dermatitis 11/18/2016   OSA (obstructive sleep apnea) 10/10/2015   Gastroesophageal reflux disease without esophagitis 10/08/2015   Other seasonal allergic rhinitis 10/03/2015   Eczema 09/16/2015   Morbid obesity due to excess calories (HCC)     Allergies:  Allergies  Allergen Reactions   Nickel    Medications:  Current Outpatient Medications:    fluconazole  (DIFLUCAN ) 150 MG tablet, Take 1 tablet PO once. Repeat in 3 days if needed., Disp: 2 tablet, Rfl: 0   Iron , Ferrous Sulfate , 325 (65 Fe) MG TABS, Take 325 mg by mouth daily., Disp: 30 tablet, Rfl: 3   Semaglutide -Weight Management (WEGOVY ) 1 MG/0.5ML SOAJ, Inject 1 mg into the skin once  a week for 4 doses., Disp: 2 mL, Rfl: 0  Observations/Objective: Patient is well-developed, well-nourished in no acute distress.  Resting comfortably at home.  Head is normocephalic, atraumatic.  No labored breathing. Speech is clear and coherent with logical content.  Patient is alert and oriented at baseline.   Assessment and Plan: 1. Yeast vaginitis (Primary) - fluconazole  (DIFLUCAN ) 150 MG  tablet; Take 1 tablet PO once. Repeat in 3 days if needed.  Dispense: 2 tablet; Refill: 0  Supportive measures and OTC medications. Diflucan  per orders. Follow-up in person for any non-resolving, new or worsening symptoms.   Follow Up Instructions: I discussed the assessment and treatment plan with the patient. The patient was provided an opportunity to ask questions and all were answered. The patient agreed with the plan and demonstrated an understanding of the instructions.  A copy of instructions were sent to the patient via MyChart unless otherwise noted below.   The patient was advised to call back or seek an in-person evaluation if the symptoms worsen or if the condition fails to improve as anticipated.    Carrie Velma Lunger, PA-C

## 2024-01-13 ENCOUNTER — Encounter: Payer: Self-pay | Admitting: Nurse Practitioner

## 2024-01-17 ENCOUNTER — Telehealth: Admitting: Nurse Practitioner

## 2024-01-17 ENCOUNTER — Other Ambulatory Visit: Payer: Self-pay | Admitting: Nurse Practitioner

## 2024-01-17 DIAGNOSIS — E66813 Obesity, class 3: Secondary | ICD-10-CM | POA: Diagnosis not present

## 2024-01-17 DIAGNOSIS — Z6841 Body Mass Index (BMI) 40.0 and over, adult: Secondary | ICD-10-CM

## 2024-01-17 DIAGNOSIS — R12 Heartburn: Secondary | ICD-10-CM | POA: Diagnosis not present

## 2024-01-17 MED ORDER — FAMOTIDINE 20 MG PO TABS: 20.0000 mg | ORAL_TABLET | Freq: Two times a day (BID) | ORAL | 2 refills | Status: DC

## 2024-01-17 MED ORDER — WEGOVY 1 MG/0.5ML ~~LOC~~ SOAJ: 1.0000 mg | SUBCUTANEOUS | 0 refills | Status: DC

## 2024-01-17 NOTE — Progress Notes (Signed)
 Location: Patient: in office  Provider: In office     Patient: Carrie Allen   DOB: [birth year]   28 y.o. Female  MRN: [id number]  Subjective:    No chief complaint on file.   Carrie Allen is a 28 y.o. female who presents today for follow up on weight loss. Overall she has tolerated the medicine. She might have one episode a month of emesis. This is typically in the morning first when she wakes up.   She has only had one episode of heartburn. That lasted less than one day.   Denies any changes in mood.   She says that her friends are telling her she looks like she is losing weight but when she weighs herself she does not see a change.   She notes that she has one day mid week where she typically has no appetite, this is mid week to her injection.   Denies any difficulty with injections or signs of infection at injection sites     Most recent fall risk assessment:    10/14/2023    9:50 AM  Fall Risk   Falls in the past year? 0  Number falls in past yr: 0  Injury with Fall? 0  Risk for fall due to : History of fall(s)  Follow up Falls evaluation completed     Most recent depression screenings:    12/13/2023   12:58 PM 11/15/2023    3:41 PM  PHQ 2/9 Scores  PHQ - 2 Score 0 0  PHQ- 9 Score 1 0      Patient Care Team: Kennyth Domino, FNP as PCP - General (Nurse Practitioner)   Outpatient Medications Prior to Visit  Medication Sig   fluconazole  (DIFLUCAN ) 150 MG tablet Take 1 tablet PO once. Repeat in 3 days if needed.   Iron , Ferrous Sulfate , 325 (65 Fe) MG TABS Take 325 mg by mouth daily.   Semaglutide -Weight Management (WEGOVY ) 1 MG/0.5ML SOAJ Inject 1 mg into the skin once a week for 4 doses.   No facility-administered medications prior to visit.    Review of Systems  Constitutional: Negative.   HENT: Negative.    Eyes: Negative.   Cardiovascular: Negative.   Gastrointestinal:  Positive for heartburn.   Genitourinary: Negative.   Skin: Negative.   Neurological: Negative.   Psychiatric/Behavioral: Negative.            Objective:     Physical Exam Constitutional:      Appearance: Normal appearance. She is obese.  HENT:     Head: Normocephalic.     Nose: Nose normal.     Mouth/Throat:     Mouth: Mucous membranes are moist.  Cardiovascular:     Rate and Rhythm: Normal rate.  Pulmonary:     Effort: Pulmonary effort is normal.  Neurological:     General: No focal deficit present.     Mental Status: She is alert.  Psychiatric:        Mood and Affect: Mood normal.         Assessment & Plan:    Routine Health Maintenance and Physical Exam  Immunization History  Administered Date(s) Administered   DTaP 12/29/1994, 08/10/1995, 09/21/1995, 12/15/1996, 02/06/1999   H1N1 07/27/2008  HIB (PRP-OMP) 12/29/1994, 08/10/1995, 09/21/1995   HPV Quadrivalent 09/09/2005, 11/25/2005, 07/27/2008   Hepatitis A 09/09/2005, 07/27/2008   Hepatitis B 09/02/1994, 12/29/1994, 08/10/1995   IPV 12/29/1994, 08/10/1995, 12/16/1995, 02/06/1999   Influenza,inj,Quad PF,6+ Mos 03/15/2015   Influenza-Unspecified 07/27/2008, 08/06/2011   MMR 12/16/1995, 02/06/1999   Meningococcal Conjugate 08/06/2011   Td 11/25/2005   Tdap 11/25/2005   Varicella 12/16/1995, 09/09/2005    Health Maintenance  Topic Date Due   Pneumococcal Vaccine (1 of 2 - PCV) Never done   DTaP/Tdap/Td (8 - Td or Tdap) 11/26/2015   Cervical Cancer Screening (Pap smear)  04/13/2021   COVID-19 Vaccine (1 - 2024-25 season) Never done   INFLUENZA VACCINE  12/31/2023   Hepatitis B Vaccines 19-59 Average Risk  Completed   HPV VACCINES  Completed   Hepatitis C Screening  Completed   HIV Screening  Completed   Meningococcal B Vaccine  Aged Out    Discussed health benefits of physical activity, and encouraged her to engage in regular exercise appropriate for her age and condition.  Problem List Items Addressed This Visit    None  No follow-ups on file.  Follow up in 4 weeks   1. Class 3 severe obesity without serious comorbidity with body mass index (BMI) of 50.0 to 59.9 in adult, unspecified obesity type  - semaglutide -weight management (WEGOVY ) 1 MG/0.5ML SOAJ SQ injection; Inject 1 mg into the skin once a week for 4 doses.  Dispense: 2 mL; Refill: 0  2. Heartburn (Primary)  - famotidine  (PEPCID ) 20 MG tablet; Take 1 tablet (20 mg total) by mouth 2 (two) times daily.  Dispense: 60 tablet; Refill: 2     Lauraine Kitty, FNP  **Disclaimer: This note may have been dictated with voice recognition software. Similar sounding words can inadvertently be transcribed and this note may contain transcription errors which may not have been corrected upon publication of note.**

## 2024-01-20 ENCOUNTER — Encounter: Payer: Self-pay | Admitting: Nurse Practitioner

## 2024-02-07 ENCOUNTER — Other Ambulatory Visit: Payer: Self-pay

## 2024-02-07 ENCOUNTER — Encounter: Payer: Self-pay | Admitting: Nurse Practitioner

## 2024-02-07 ENCOUNTER — Ambulatory Visit: Admitting: Nurse Practitioner

## 2024-02-07 DIAGNOSIS — Z6841 Body Mass Index (BMI) 40.0 and over, adult: Secondary | ICD-10-CM

## 2024-02-07 MED ORDER — WEGOVY 1 MG/0.5ML ~~LOC~~ SOAJ
1.0000 mg | SUBCUTANEOUS | 0 refills | Status: AC
  Filled 2024-02-07 – 2024-02-24 (×3): qty 2, 28d supply, fill #0

## 2024-02-10 ENCOUNTER — Ambulatory Visit: Admitting: Nurse Practitioner

## 2024-02-16 ENCOUNTER — Other Ambulatory Visit: Payer: Self-pay

## 2024-02-18 ENCOUNTER — Other Ambulatory Visit (HOSPITAL_COMMUNITY): Payer: Self-pay

## 2024-02-18 ENCOUNTER — Other Ambulatory Visit: Payer: Self-pay

## 2024-02-23 ENCOUNTER — Encounter (HOSPITAL_COMMUNITY): Payer: Self-pay

## 2024-02-23 ENCOUNTER — Other Ambulatory Visit: Payer: Self-pay

## 2024-02-23 ENCOUNTER — Other Ambulatory Visit (HOSPITAL_COMMUNITY): Payer: Self-pay

## 2024-02-24 ENCOUNTER — Other Ambulatory Visit: Payer: Self-pay

## 2024-02-24 ENCOUNTER — Other Ambulatory Visit (HOSPITAL_COMMUNITY): Payer: Self-pay

## 2024-02-24 NOTE — Progress Notes (Signed)
 The patient attended a screening event on 10/14/2023 where her screening results were a BP of 122/80. At the event the patient noted having housing and transportation SDOH needs. Patient was given housing, transportation, and utility SDOH resources at the time of her appt.  Per chart review patient has Lauraine Kitty, FNP as her PCP and has Medicaid for insurance. Patient has had several visits with Lauraine Kitty since the initial screening event on 10/14/2023. The patient had two appointments scheduled for September but No Showed them. The patient does not have any future appts scheduled at this time. Per chart review patient has a smoking and housing SDOH need.  CHW attempted to reach out to patient for 60 day f/u. CHW left a vm for patient to call back. Letter along with housing, transportation, utility, and smoking resources mailed. An additional follow up will be done in according to the health equity team's protocol.

## 2024-02-28 ENCOUNTER — Other Ambulatory Visit: Payer: Self-pay

## 2024-03-02 ENCOUNTER — Other Ambulatory Visit: Payer: Self-pay

## 2024-04-27 ENCOUNTER — Emergency Department (HOSPITAL_COMMUNITY)
Admission: EM | Admit: 2024-04-27 | Discharge: 2024-04-27 | Disposition: A | Discharge status: Home / Self Care | Attending: Emergency Medicine | Admitting: Emergency Medicine

## 2024-04-27 ENCOUNTER — Encounter (HOSPITAL_COMMUNITY): Payer: Self-pay

## 2024-04-27 ENCOUNTER — Other Ambulatory Visit: Payer: Self-pay

## 2024-04-27 DIAGNOSIS — N939 Abnormal uterine and vaginal bleeding, unspecified: Secondary | ICD-10-CM | POA: Insufficient documentation

## 2024-04-27 DIAGNOSIS — D72829 Elevated white blood cell count, unspecified: Secondary | ICD-10-CM | POA: Diagnosis not present

## 2024-04-27 LAB — BASIC METABOLIC PANEL WITH GFR
Anion gap: 10 (ref 5–15)
BUN: 10 mg/dL (ref 6–20)
CO2: 27 mmol/L (ref 22–32)
Calcium: 9.3 mg/dL (ref 8.9–10.3)
Chloride: 101 mmol/L (ref 98–111)
Creatinine, Ser: 0.65 mg/dL (ref 0.44–1.00)
GFR, Estimated: >60 mL/min (ref >60)
Glucose, Bld: 111 mg/dL — ABNORMAL HIGH (ref 70–99)
Potassium: 3.9 mmol/L (ref 3.5–5.1)
Sodium: 139 mmol/L (ref 135–145)

## 2024-04-27 LAB — URINALYSIS, ROUTINE W REFLEX MICROSCOPIC
Bacteria, UA: NONE SEEN
Bilirubin Urine: NEGATIVE
Glucose, UA: NEGATIVE mg/dL
Ketones, ur: NEGATIVE mg/dL
Leukocytes,Ua: NEGATIVE
Nitrite: NEGATIVE
Protein, ur: 100 mg/dL — AB
RBC / HPF: >50 RBC/hpf (ref 0–5)
Specific Gravity, Urine: 1.029 (ref 1.005–1.030)
pH: 6 (ref 5.0–8.0)

## 2024-04-27 LAB — CBC WITH DIFFERENTIAL/PLATELET
Abs Immature Granulocytes: 0.05 K/uL (ref 0.00–0.07)
Basophils Absolute: 0 K/uL (ref 0.0–0.1)
Basophils Relative: 0 %
Eosinophils Absolute: 0.1 K/uL (ref 0.0–0.5)
Eosinophils Relative: 1 %
HCT: 40.7 % (ref 36.0–46.0)
Hemoglobin: 12.5 g/dL (ref 12.0–15.0)
Immature Granulocytes: 0 %
Lymphocytes Relative: 13 %
Lymphs Abs: 2 K/uL (ref 0.7–4.0)
MCH: 25.9 pg — ABNORMAL LOW (ref 26.0–34.0)
MCHC: 30.7 g/dL (ref 30.0–36.0)
MCV: 84.4 fL (ref 80.0–100.0)
Monocytes Absolute: 0.7 K/uL (ref 0.1–1.0)
Monocytes Relative: 5 %
Neutro Abs: 12.2 K/uL — ABNORMAL HIGH (ref 1.7–7.7)
Neutrophils Relative %: 81 %
Platelets: 288 K/uL (ref 150–400)
RBC: 4.82 MIL/uL (ref 3.87–5.11)
RDW: 14.9 % (ref 11.5–15.5)
WBC: 15.2 K/uL — ABNORMAL HIGH (ref 4.0–10.5)
nRBC: 0 % (ref 0.0–0.2)

## 2024-04-27 LAB — HCG, SERUM, QUALITATIVE: Preg, Serum: NEGATIVE

## 2024-04-27 LAB — ABO/RH: ABO/RH(D): O POS

## 2024-04-27 LAB — HCG, QUANTITATIVE, PREGNANCY: hCG, Beta Chain, Quant, S: 6 m[IU]/mL — ABNORMAL HIGH (ref <5)

## 2024-04-27 NOTE — ED Provider Notes (Signed)
 [place] EMERGENCY DEPARTMENT AT [facility] Provider Note   CSN: [id number] Arrival date & time: 04/27/24  9847     Patient presents with: Vaginal Bleeding   Carrie Allen is a 27 y.o. female.   28 year old female presents to the emergency department for vaginal bleeding.  She began experiencing bright red vaginal bleeding while at work this evening.  She has only required the use of 1 pad/tampon since bleeding started.  She has not had any significant abdominal pain or pelvic pain.  No fevers, vomiting, bowel changes.  She reports having 2 positive home pregnancy tests approximately 1 week ago.  States that she was seen at a freestanding clinic a day or so later with a positive urine pregnancy at that visit.  The history is provided by the patient. No language interpreter was used.  Vaginal Bleeding      Prior to Admission medications   Medication Sig Start Date End Date Taking? Authorizing Provider  famotidine  (PEPCID ) 20 MG tablet Take 1 tablet (20 mg total) by mouth 2 (two) times daily. 01/17/24 04/16/24  Kennyth Domino, FNP  fluconazole  (DIFLUCAN ) 150 MG tablet Take 1 tablet PO once. Repeat in 3 days if needed. 01/08/24   Gladis Elsie BROCKS, PA-C  Iron , Ferrous Sulfate , 325 (65 Fe) MG TABS Take 325 mg by mouth daily. 10/22/23   Kennyth Domino, FNP    Allergies: Nickel    Review of Systems  Genitourinary:  Positive for vaginal bleeding.  Ten systems reviewed and are negative for acute change, except as noted in the HPI.    Updated Vital Signs BP 124/70   Pulse 90   Temp 98.4 F (36.9 C) (Oral)   Resp 14   SpO2 97%   Physical Exam Vitals and nursing note reviewed.  Constitutional:      General: She is not in acute distress.    Appearance: She is well-developed. She is not diaphoretic.     Comments: Nontoxic-appearing, obese female.  Intermittently tearful.  HENT:     Head: Normocephalic and atraumatic.  Eyes:     General: No scleral icterus.     Conjunctiva/sclera: Conjunctivae normal.  Pulmonary:     Effort: Pulmonary effort is normal. No respiratory distress.     Comments: Respirations even and unlabored Abdominal:     Palpations: Abdomen is soft.     Comments: Soft, nontender abdomen.  No focal tenderness or palpable masses noted.  No peritoneal signs.  Musculoskeletal:        General: Normal range of motion.     Cervical back: Normal range of motion.  Skin:    General: Skin is warm and dry.     Coloration: Skin is not pale.     Findings: No erythema or rash.  Neurological:     Mental Status: She is alert and oriented to person, place, and time.     Coordination: Coordination normal.  Psychiatric:        Behavior: Behavior normal.     (all labs ordered are listed, but only abnormal results are displayed) Labs Reviewed  CBC WITH DIFFERENTIAL/PLATELET - Abnormal; Notable for the following components:      Result Value   WBC 15.2 (*)    MCH 25.9 (*)    Neutro Abs 12.2 (*)    All other components within normal limits  URINALYSIS, ROUTINE W REFLEX MICROSCOPIC - Abnormal; Notable for the following components:   APPearance CLOUDY (*)    Hgb urine dipstick LARGE (*)  Protein, ur 100 (*)    All other components within normal limits  BASIC METABOLIC PANEL WITH GFR - Abnormal; Notable for the following components:   Glucose, Bld 111 (*)    All other components within normal limits  HCG, QUANTITATIVE, PREGNANCY - Abnormal; Notable for the following components:   hCG, Beta Chain, Quant, S 6 (*)    All other components within normal limits  HCG, SERUM, QUALITATIVE  ABO/RH    EKG: None  Radiology: No results found.   Procedures   Medications Ordered in the ED - No data to display                                  Medical Decision Making Amount and/or Complexity of Data Reviewed Labs: ordered.   This patient presents to the ED for concern of vaginal bleeding, this involves an extensive number of treatment  options, and is a complaint that carries with it a high risk of complications and morbidity.  The differential diagnosis includes menorrhagia vs ectopic pregnancy vs SAB vs vaginal laceration vs malignancy   Co morbidities that complicate the patient evaluation  Obesity Sleep apnea   Additional history obtained:  External records from outside source obtained and reviewed including prior discharge summaries.   Lab Tests:  I Ordered, and personally interpreted labs.  The pertinent results include:  WBC 15.2, UA with hematuria (like contamination from menses), hCG qualitative negative. Quant resulted at 6.   Cardiac Monitoring:  The patient was maintained on a cardiac monitor.  I personally viewed and interpreted the cardiac monitored which showed an underlying rhythm of: NSR   Medicines ordered and prescription drug management:  I have reviewed the patients home medicines and have made adjustments as needed   Test Considered:  Pelvic US  - felt low yield   Problem List / ED Course:  Patient presenting for vaginal bleeding.  Clinically, her picture is most consistent with a scheduled menstrual cycle.  States that her previous menstrual period was on 03/27/2024.  Reports approximately 1 month between menses. She does report home positive pregnancy tests with a subsequent positive test and a clinic.  Her testing today is negative on urinalysis.  Quant was repeated, but returned at 6.  In the presence of vaginal bleeding this may represent a chemical pregnancy. Hemodynamically stable and without pain.  Have encouraged further follow-up with a primary care doctor or OB/GYN.   Reevaluation:  After the interventions noted above, I reevaluated the patient and found that they have :stayed the same   Social Determinants of Health:  Lives independently   Dispostion:  After consideration of the diagnostic results and the patients response to treatment, I feel that the patent  would benefit from outpatient f/u. Return precautions discussed and provided. Patient discharged in stable condition with no unaddressed concerns.       Final diagnoses:  Vaginal bleeding    ED Discharge Orders     None          Keith Sor, PA-C 04/27/24 [id number]    Trine Raynell Moder, MD 04/27/24 [phone]

## 2024-04-27 NOTE — Discharge Instructions (Addendum)
 It is possible that you experienced a chemical pregnancy.  A chemical pregnancy is a pregnancy loss that happens before the fifth week.  In this scenario, you may test positive on a pregnancy test only to get a negative result a few weeks later.  We advise bleeding precautions; return to the emergency department if bleeding becomes significant and you are utilizing more than 1 pad or tampon per hour for 5 or more consecutive hours.  Take Tylenol  or ibuprofen  for cramping.  Return for any other new or concerning symptoms.

## 2024-04-27 NOTE — ED Triage Notes (Signed)
 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 Pt states that it feels like she is about to start her period.

## 2024-06-14 NOTE — Progress Notes (Signed)
 Pt attended 10/14/2023 [facility] appointment with BP of 122/80. At appt pt did indicate housing & transportation SDOH needs.   Per previous [facility] encounter, Patient was given housing, transportation, and utility SDOH resources at the time of her appt.   Per 6 month f/u pt was reached out via phone and was left vm. Pt was mailed 3rd and final letter with Get Care Now flyer, Multiple [facility] flyer, food, housing, & transportation resources, [employer] Class flyer, BP management flyer, BP log, smoking cessation flyer, and [place] [address] card.  Per chart review pt does not have a PCP, has insurance, and is a smoker. Pt does indicate housing SDOH needs at this time.  No additional pt f/u to be scheduled at this time per health equity protocol.

## 2024-06-19 ENCOUNTER — Ambulatory Visit: Admitting: Nurse Practitioner

## 2024-06-21 ENCOUNTER — Encounter: Payer: Self-pay | Admitting: Nurse Practitioner

## 2024-06-22 ENCOUNTER — Other Ambulatory Visit: Payer: Self-pay

## 2024-06-22 ENCOUNTER — Encounter: Payer: Self-pay | Admitting: Nurse Practitioner

## 2024-06-22 ENCOUNTER — Telehealth: Payer: Self-pay | Admitting: Nurse Practitioner

## 2024-06-22 DIAGNOSIS — N926 Irregular menstruation, unspecified: Secondary | ICD-10-CM

## 2024-06-22 DIAGNOSIS — G4733 Obstructive sleep apnea (adult) (pediatric): Secondary | ICD-10-CM

## 2024-06-22 MED ORDER — WEGOVY 1.5 MG PO TABS
1.5000 mg | ORAL_TABLET | Freq: Every day | ORAL | 0 refills | Status: DC
  Filled 2024-06-22: qty 30, 30d supply, fill #0

## 2024-06-22 NOTE — Telephone Encounter (Signed)
 Patient would like to restart Wegovy .   Was using the injections last year, had to stop due to insurance failure to cover based on Obesity. She was diagnosed with Sleep Apnea in 2017, was given CPAP.  She would like to see if insurance will now cover it based on her history of Sleep Apnea    Medical records reviewed to verify diagnosis   Denies SE to Wegovy  previously   Needs pregnancy testing prior to starting plans to come to clinic Monday for labs

## 2024-06-26 ENCOUNTER — Other Ambulatory Visit: Payer: Self-pay

## 2024-06-26 ENCOUNTER — Other Ambulatory Visit: Admitting: Nurse Practitioner

## 2024-06-29 ENCOUNTER — Encounter: Payer: Self-pay | Admitting: Nurse Practitioner

## 2024-06-29 ENCOUNTER — Other Ambulatory Visit: Payer: Self-pay

## 2024-06-29 ENCOUNTER — Telehealth: Admitting: Nurse Practitioner

## 2024-06-29 ENCOUNTER — Other Ambulatory Visit: Admitting: Nurse Practitioner

## 2024-06-29 VITALS — BP 129/78 | HR 95 | Resp 16 | Wt 316.0 lb

## 2024-06-29 DIAGNOSIS — G4733 Obstructive sleep apnea (adult) (pediatric): Secondary | ICD-10-CM

## 2024-06-29 DIAGNOSIS — Z3201 Encounter for pregnancy test, result positive: Secondary | ICD-10-CM

## 2024-06-29 DIAGNOSIS — Z3200 Encounter for pregnancy test, result unknown: Secondary | ICD-10-CM

## 2024-06-29 LAB — POCT URINE PREGNANCY: Preg Test, Ur: POSITIVE — AB

## 2024-06-29 NOTE — Progress Notes (Signed)
 Labs only

## 2024-06-29 NOTE — Progress Notes (Signed)
 Presents for labs only after recent Video Visit   Positive in house urine pregnancy - patient prefers referral to [facility]    1. Obstructive sleep apnea syndrome  - VITAMIN D  25 Hydroxy (Vit-D Deficiency, Fractures) - TSH - Lipid panel - Hemoglobin A1c - Comprehensive metabolic panel with GFR - CBC with Differential/Platelet  2. Morbid obesity (HCC)  - VITAMIN D  25 Hydroxy (Vit-D Deficiency, Fractures) - TSH - Lipid panel - Hemoglobin A1c - Comprehensive metabolic panel with GFR - CBC with Differential/Platelet  3. Encounter for pregnancy test, result unknown (Primary)  - POCT urine pregnancy  4. Pregnancy test positive  - hCG, serum, qualitative - Ambulatory referral to Obstetrics / Gynecology

## 2024-06-29 NOTE — Addendum Note (Signed)
 Addended by: KENNYTH LAURAINE BRAVO on: 06/29/2024 09:43 AM   Modules accepted: Orders

## 2024-06-30 LAB — CBC WITH DIFFERENTIAL/PLATELET
Basophils Absolute: 0 10*3/uL (ref 0.0–0.2)
Basos: 0 %
EOS (ABSOLUTE): 0.1 10*3/uL (ref 0.0–0.4)
Eos: 1 %
Hematocrit: 40.1 % (ref 34.0–46.6)
Hemoglobin: 12.3 g/dL (ref 11.1–15.9)
Immature Grans (Abs): 0 10*3/uL (ref 0.0–0.1)
Immature Granulocytes: 0 %
Lymphocytes Absolute: 2.4 10*3/uL (ref 0.7–3.1)
Lymphs: 19 %
MCH: 25.8 pg — ABNORMAL LOW (ref 26.6–33.0)
MCHC: 30.7 g/dL — ABNORMAL LOW (ref 31.5–35.7)
MCV: 84 fL (ref 79–97)
Monocytes Absolute: 0.7 10*3/uL (ref 0.1–0.9)
Monocytes: 6 %
Neutrophils Absolute: 9.4 10*3/uL — ABNORMAL HIGH (ref 1.4–7.0)
Neutrophils: 74 %
Platelets: 279 10*3/uL (ref 150–450)
RBC: 4.76 x10E6/uL (ref 3.77–5.28)
RDW: 13.9 % (ref 11.7–15.4)
WBC: 12.7 10*3/uL — ABNORMAL HIGH (ref 3.4–10.8)

## 2024-06-30 LAB — COMPREHENSIVE METABOLIC PANEL WITH GFR
ALT: 10 [IU]/L (ref 0–32)
AST: 12 [IU]/L (ref 0–40)
Albumin: 4.4 g/dL (ref 4.0–5.0)
Alkaline Phosphatase: 89 [IU]/L (ref 41–116)
BUN/Creatinine Ratio: 14 (ref 9–23)
BUN: 10 mg/dL (ref 6–20)
Bilirubin Total: <0.2 mg/dL (ref 0.0–1.2)
CO2: 20 mmol/L (ref 20–29)
Calcium: 9.2 mg/dL (ref 8.7–10.2)
Chloride: 100 mmol/L (ref 96–106)
Creatinine, Ser: 0.72 mg/dL (ref 0.57–1.00)
Globulin, Total: 2.5 g/dL (ref 1.5–4.5)
Glucose: 77 mg/dL (ref 70–99)
Potassium: 4.1 mmol/L (ref 3.5–5.2)
Sodium: 138 mmol/L (ref 134–144)
Total Protein: 6.9 g/dL (ref 6.0–8.5)
eGFR: 116 mL/min/{1.73_m2}

## 2024-06-30 LAB — VITAMIN D 25 HYDROXY (VIT D DEFICIENCY, FRACTURES): Vit D, 25-Hydroxy: 6.5 ng/mL — ABNORMAL LOW (ref 30.0–100.0)

## 2024-06-30 LAB — LIPID PANEL
Chol/HDL Ratio: 2.9 ratio (ref 0.0–4.4)
Cholesterol, Total: 153 mg/dL (ref 100–199)
HDL: 53 mg/dL
LDL Chol Calc (NIH): 81 mg/dL (ref 0–99)
Triglycerides: 101 mg/dL (ref 0–149)
VLDL Cholesterol Cal: 19 mg/dL (ref 5–40)

## 2024-06-30 LAB — HCG, SERUM, QUALITATIVE: hCG,Beta Subunit,Qual,Serum: POSITIVE m[IU]/mL — AB

## 2024-06-30 LAB — HEMOGLOBIN A1C
Est. average glucose Bld gHb Est-mCnc: 105 mg/dL
Hgb A1c MFr Bld: 5.3 % (ref 4.8–5.6)

## 2024-06-30 LAB — TSH: TSH: 2.96 u[IU]/mL (ref 0.450–4.500)

## 2024-07-03 ENCOUNTER — Ambulatory Visit: Payer: Self-pay | Admitting: Nurse Practitioner

## 2024-07-03 DIAGNOSIS — E559 Vitamin D deficiency, unspecified: Secondary | ICD-10-CM

## 2024-07-03 MED ORDER — VITAMIN D (ERGOCALCIFEROL) 1.25 MG (50000 UNIT) PO CAPS: 50000.0000 [IU] | ORAL_CAPSULE | ORAL | 0 refills | Status: AC
